# Patient Record
Sex: Male | Born: 1950 | Race: White | Hispanic: No | Marital: Married | Smoking: Never smoker
Health system: Southern US, Community
[De-identification: ages and names within clinical notes are randomized; demographics above are authoritative.]

## PROBLEM LIST (undated history)

## (undated) DIAGNOSIS — I499 Cardiac arrhythmia, unspecified: Secondary | ICD-10-CM

## (undated) DIAGNOSIS — H409 Unspecified glaucoma: Secondary | ICD-10-CM

## (undated) DIAGNOSIS — E785 Hyperlipidemia, unspecified: Secondary | ICD-10-CM

## (undated) HISTORY — DX: Cardiac arrhythmia, unspecified: I49.9

## (undated) HISTORY — PX: HAND SURGERY: SHX662

## (undated) HISTORY — DX: Hyperlipidemia, unspecified: E78.5

## (undated) HISTORY — DX: Unspecified glaucoma: H40.9

---
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
## 2004-03-23 HISTORY — PX: FLEXIBLE SIGMOIDOSCOPY: SHX1649

## 2008-04-23 HISTORY — PX: COLONOSCOPY: SHX174

## 2014-07-23 ENCOUNTER — Emergency Department (HOSPITAL_COMMUNITY)
Admission: EM | Admit: 2014-07-23 | Discharge: 2014-07-23 | Disposition: A | Discharge status: Home / Self Care | Payer: BC Managed Care – PPO | Attending: Emergency Medicine | Admitting: Emergency Medicine

## 2014-07-23 ENCOUNTER — Encounter (HOSPITAL_COMMUNITY): Payer: Self-pay | Admitting: Emergency Medicine

## 2014-07-23 DIAGNOSIS — R5383 Other fatigue: Secondary | ICD-10-CM | POA: Diagnosis not present

## 2014-07-23 DIAGNOSIS — R300 Dysuria: Secondary | ICD-10-CM | POA: Diagnosis present

## 2014-07-23 DIAGNOSIS — N410 Acute prostatitis: Secondary | ICD-10-CM | POA: Insufficient documentation

## 2014-07-23 DIAGNOSIS — Z79899 Other long term (current) drug therapy: Secondary | ICD-10-CM | POA: Diagnosis not present

## 2014-07-23 DIAGNOSIS — Z8639 Personal history of other endocrine, nutritional and metabolic disease: Secondary | ICD-10-CM | POA: Diagnosis not present

## 2014-07-23 DIAGNOSIS — H409 Unspecified glaucoma: Secondary | ICD-10-CM | POA: Diagnosis not present

## 2014-07-23 LAB — CBC WITH DIFFERENTIAL/PLATELET
Basophils Absolute: 0 10*3/uL (ref 0.0–0.1)
Basophils Relative: 0 % (ref 0–1)
Eosinophils Absolute: 0 10*3/uL (ref 0.0–0.7)
Eosinophils Relative: 0 % (ref 0–5)
HCT: 42.8 % (ref 39.0–52.0)
Hemoglobin: 14.4 g/dL (ref 13.0–17.0)
Lymphocytes Relative: 5 % — ABNORMAL LOW (ref 12–46)
Lymphs Abs: 0.6 10*3/uL — ABNORMAL LOW (ref 0.7–4.0)
MCH: 31.1 pg (ref 26.0–34.0)
MCHC: 33.6 g/dL (ref 30.0–36.0)
MCV: 92.4 fL (ref 78.0–100.0)
Monocytes Absolute: 0.3 10*3/uL (ref 0.1–1.0)
Monocytes Relative: 2 % — ABNORMAL LOW (ref 3–12)
Neutro Abs: 10.7 10*3/uL — ABNORMAL HIGH (ref 1.7–7.7)
Neutrophils Relative %: 93 % — ABNORMAL HIGH (ref 43–77)
Platelets: 180 10*3/uL (ref 150–400)
RBC: 4.63 MIL/uL (ref 4.22–5.81)
RDW: 12.3 % (ref 11.5–15.5)
WBC: 11.6 10*3/uL — ABNORMAL HIGH (ref 4.0–10.5)

## 2014-07-23 LAB — COMPREHENSIVE METABOLIC PANEL
ALT: 19 U/L (ref 0–53)
AST: 22 U/L (ref 0–37)
Albumin: 4.1 g/dL (ref 3.5–5.2)
Alkaline Phosphatase: 58 U/L (ref 39–117)
Anion gap: 7 (ref 5–15)
BUN: 15 mg/dL (ref 6–23)
CO2: 29 mmol/L (ref 19–32)
Calcium: 8.6 mg/dL (ref 8.4–10.5)
Chloride: 101 mmol/L (ref 96–112)
Creatinine, Ser: 0.9 mg/dL (ref 0.50–1.35)
GFR calc Af Amer: >90 mL/min (ref >90)
GFR calc non Af Amer: >90 mL/min (ref >90)
Glucose, Bld: 120 mg/dL — ABNORMAL HIGH (ref 70–99)
Potassium: 3.9 mmol/L (ref 3.5–5.1)
Sodium: 137 mmol/L (ref 135–145)
Total Bilirubin: 1.4 mg/dL — ABNORMAL HIGH (ref 0.3–1.2)
Total Protein: 6.6 g/dL (ref 6.0–8.3)

## 2014-07-23 LAB — URINALYSIS, ROUTINE W REFLEX MICROSCOPIC
Bilirubin Urine: NEGATIVE
Glucose, UA: NEGATIVE mg/dL
Ketones, ur: NEGATIVE mg/dL
Nitrite: NEGATIVE
Protein, ur: NEGATIVE mg/dL
Specific Gravity, Urine: 1.008 (ref 1.005–1.030)
Urobilinogen, UA: 0.2 mg/dL (ref 0.0–1.0)
pH: 5 (ref 5.0–8.0)

## 2014-07-23 LAB — URINE MICROSCOPIC-ADD ON

## 2014-07-23 LAB — I-STAT CG4 LACTIC ACID, ED: Lactic Acid, Venous: 1.32 mmol/L (ref 0.5–2.0)

## 2014-07-23 MED ORDER — CIPROFLOXACIN HCL 500 MG PO TABS
500.0000 mg | ORAL_TABLET | Freq: Once | ORAL | Status: AC
  Administered 2014-07-23: 500 mg via ORAL
  Filled 2014-07-23: qty 1

## 2014-07-23 MED ORDER — SODIUM CHLORIDE 0.9 % IV BOLUS (SEPSIS)
1000.0000 mL | Freq: Once | INTRAVENOUS | Status: AC
  Administered 2014-07-23: 1000 mL via INTRAVENOUS

## 2014-07-23 MED ORDER — IBUPROFEN 600 MG PO TABS: 600.0000 mg | ORAL_TABLET | Freq: Four times a day (QID) | ORAL | Status: DC | PRN

## 2014-07-23 MED ORDER — CIPROFLOXACIN HCL 500 MG PO TABS: 500.0000 mg | ORAL_TABLET | Freq: Two times a day (BID) | ORAL | Status: DC

## 2014-07-23 MED ORDER — ACETAMINOPHEN 325 MG PO TABS
650.0000 mg | ORAL_TABLET | Freq: Once | ORAL | Status: AC
  Administered 2014-07-23: 650 mg via ORAL
  Filled 2014-07-23: qty 2

## 2014-07-23 MED ORDER — HYDROCODONE-ACETAMINOPHEN 5-325 MG PO TABS: 1.0000 | ORAL_TABLET | Freq: Four times a day (QID) | ORAL | Status: DC | PRN

## 2014-07-23 MED ORDER — ACETAMINOPHEN 650 MG RE SUPP: 650.0000 mg | Freq: Once | RECTAL | Status: DC

## 2014-07-23 NOTE — ED Notes (Signed)
Pt c/o dysuria and fever since yesterday. Pt has hx of UTI. Pt took naproxen at 6:45 pm tonight. A&Ox4. Pt ambulatory. Pt c/o chills. Denies N/V. Denies blood in urine.

## 2014-07-23 NOTE — ED Notes (Signed)
Patient's wife asking to speak with PA prior to DC home Will make EDP aware

## 2014-07-23 NOTE — ED Provider Notes (Signed)
CSN: [id number]     Arrival date & time 07/23/14  1932 History   First MD Initiated Contact with Patient 07/23/14 2030     Chief Complaint  Patient presents with  . Dysuria  . Fever    (Consider location/radiation/quality/duration/timing/severity/associated sxs/prior Treatment) HPI Comments: Patient is a 64 year old male with a history of hyperlipidemia and appendectomy who presents to the emergency department for further evaluation of fever. Patient states that he experienced some dysuria this morning which resolved. He states he was away seeing his daughter when he began to feel fatigued with an ache in his low back this afternoon. Patient took ibuprofen at 1500 for symptoms with mild, temporary relief. He states that he started to develop worsening back pain with chills this evening. This did not improve with naproxen. Patient reports a temperature of 100.72F today. He denies associated chest pain, shortness of breath, cough, lightheadedness, nausea, vomiting, and diarrhea. No hematuria. Patient reports a history of UTI/prostatitis while in college in 1973. No history of frequent UTIs. No sick contacts. Patient states he is sexually active with his wife only.  The history is provided by the patient. No language interpreter was used.    Past Medical History  Diagnosis Date  . Hyperlipidemia   . Glaucoma     pigmentary   Past Surgical History  Procedure Laterality Date  . Appendectomy  1966  . Tonsillectomy  1972  . Flexible sigmoidoscopy  10/05  . Colonoscopy  11/09  . Hand surgery      R hand broken repair    Family History  Problem Relation Age of Onset  . Ovarian cancer Mother   . Schizophrenia Sister    History  Substance Use Topics  . Smoking status: Never Smoker   . Smokeless tobacco: Not on file  . Alcohol Use: Yes    Review of Systems  Constitutional: Positive for fever, chills and fatigue.  Gastrointestinal: Negative for nausea, vomiting, abdominal pain and  diarrhea.  Genitourinary: Positive for dysuria. Negative for hematuria, discharge and scrotal swelling.       Negative for incontinence  Musculoskeletal: Positive for back pain.  All other systems reviewed and are negative.   Allergies  Review of patient's allergies indicates no known allergies.  Home Medications   Prior to Admission medications   Medication Sig Start Date End Date Taking? Authorizing Provider  brimonidine (ALPHAGAN) 0.15 % ophthalmic solution Place 1 drop into the right eye 2 (two) times daily.  07/13/14  Yes Historical Provider, MD  Cranberry-Vitamin C-Probiotic (AZO CRANBERRY) 250-30 MG TABS Take 2 tablets by mouth 2 (two) times daily as needed (pain).   Yes Historical Provider, MD  cromolyn (OPTICROM) 4 % ophthalmic solution Place 1 drop into the left eye 2 (two) times daily.  07/13/14  Yes Historical Provider, MD  dorzolamide-timolol (COSOPT) 22.3-6.8 MG/ML ophthalmic solution Place 1 drop into both eyes 2 (two) times daily. Cosopt 22.3-6.8 MG/ML Solution 1 drop into affected eye Twice a day   Yes Historical Provider, MD  latanoprost (XALATAN) 0.005 % ophthalmic solution Place 1 drop into the left eye at bedtime.   Yes Historical Provider, MD  Multiple Vitamin (MULTIVITAMIN) tablet Take 1 tablet by mouth daily.   Yes Historical Provider, MD  naproxen sodium (ANAPROX) 220 MG tablet Take 220 mg by mouth 2 (two) times daily as needed (pain).   Yes Historical Provider, MD  Omega-3 Fatty Acids (FISH OIL PO) Take 1,000 mg by mouth daily. Fish Oil 1000 MG Capsule 1  capsule with a meal Once a day   Yes Historical Provider, MD  Red Yeast Rice Extract (RED YEAST RICE PO) Take 600 mg by mouth daily.    Yes Historical Provider, MD  ciprofloxacin (CIPRO) 500 MG tablet Take 1 tablet (500 mg total) by mouth 2 (two) times daily. Take for the full 6 weeks unless otherwise instructed by a urologist 07/23/14   Antony Madura, PA-C  HYDROcodone-acetaminophen (NORCO/VICODIN) 5-325 MG per tablet  Take 1-2 tablets by mouth every 6 (six) hours as needed for moderate pain or severe pain. 07/23/14   Antony Madura, PA-C  ibuprofen (ADVIL,MOTRIN) 600 MG tablet Take 1 tablet (600 mg total) by mouth every 6 (six) hours as needed for fever, mild pain or moderate pain. 07/23/14   Antony Madura, PA-C   BP 120/60 mmHg  Pulse 92  Temp(Src) 100.5 F (38.1 C) (Oral)  Resp 18  SpO2 98%   Physical Exam  Constitutional: He is oriented to person, place, and time. He appears well-developed and well-nourished. No distress.  Nontoxic/nonseptic appearing  HENT:  Head: Normocephalic and atraumatic.  Eyes: Conjunctivae and EOM are normal. No scleral icterus.  Neck: Normal range of motion.  Cardiovascular: Normal rate, regular rhythm and intact distal pulses.   Pulmonary/Chest: Effort normal and breath sounds normal. No respiratory distress. He has no wheezes. He has no rales.  Respirations even and unlabored. Lungs clear.  Abdominal: Soft. He exhibits no distension. There is no tenderness. There is no rebound and no guarding.  No abdominal tenderness or masses. No CVA tenderness. No peritoneal signs or guarding.  Genitourinary: Rectal exam shows no external hemorrhoid, no internal hemorrhoid, no tenderness and anal tone normal. Prostate is enlarged. Prostate is not tender.  Chaperoned rectal exam significant for enlarged, firm prostate without TTP  Musculoskeletal: Normal range of motion. He exhibits tenderness.  Mild tenderness to lumbar spine without bony deformities, step-offs, or crepitus. No paraspinal muscle tenderness.  Neurological: He is alert and oriented to person, place, and time.  Skin: Skin is warm and dry. No rash noted. He is not diaphoretic. No erythema. No pallor.  Psychiatric: He has a normal mood and affect. His behavior is normal.  Nursing note and vitals reviewed.   ED Course  Procedures (including critical care time) Labs Review Labs Reviewed  CBC WITH DIFFERENTIAL/PLATELET -  Abnormal; Notable for the following:    WBC 11.6 (*)    Neutrophils Relative % 93 (*)    Neutro Abs 10.7 (*)    Lymphocytes Relative 5 (*)    Lymphs Abs 0.6 (*)    Monocytes Relative 2 (*)    All other components within normal limits  COMPREHENSIVE METABOLIC PANEL - Abnormal; Notable for the following:    Glucose, Bld 120 (*)    Total Bilirubin 1.4 (*)    All other components within normal limits  URINALYSIS, ROUTINE W REFLEX MICROSCOPIC - Abnormal; Notable for the following:    APPearance CLOUDY (*)    Hgb urine dipstick SMALL (*)    Leukocytes, UA MODERATE (*)    All other components within normal limits  URINE CULTURE  URINE MICROSCOPIC-ADD ON  I-STAT CG4 LACTIC ACID, ED    Imaging Review No results found.   EKG Interpretation None      MDM   Final diagnoses:  Acute prostatitis    64 year old male presents to the emergency department for dysuria, back pain, and fever. He was febrile on arrival to 101.31F. This improved with antipyretics. No abdominal  tenderness to palpation. No CVA tenderness. Patient was noted to have an enlarged prostate on chaperoned rectal exam.   Workup today revealed a mild leukocytosis of 11.6. Urinalysis significant for TNTC WBCs. Given physical exam findings, high suspicion for prostatitis. Patient endorses a hx of this. He will be initiated on treatment with ciprofloxacin. Urine culture ordered. Patient also given referral to urology for follow-up. Return precautions discussed at length with patient. Patient agreeable to plan with no unaddressed concerns. Patient discharged in good condition; VSS.   Filed Vitals:   07/23/14 1948 07/23/14 2229 07/23/14 2302  BP: 141/78  120/60  Pulse: 82  92  Temp: 101.7 F (38.7 C) 101.9 F (38.8 C) 100.5 F (38.1 C)  TempSrc: Oral Oral Oral  Resp: 16  18  SpO2: 97%  98%       Antony Madura, PA-C 07/23/14 2344  Toy Baker, MD 07/26/14 [phone]

## 2014-07-23 NOTE — Discharge Instructions (Signed)
Take ciprofloxacin as prescribed. You will need to be treated with antibiotics for 6 weeks unless otherwise instructed by a urologist. Take ibuprofen every 6 hours as prescribed for fever and pain. You may take Norco as needed for severe pain. Do not take Tylenol if you are taking this medication. Recommend that you follow-up with a urologist. Also follow-up with your primary care doctor until you are able to see a urologist, for purposes of recheck. Return to the emergency department if symptoms worsen, such as if fever persists after an additional 2 doses of ciprofloxacin.  Prostatitis The prostate gland is about the size and shape of a walnut. It is located just below your bladder. It produces one of the components of semen, which is made up of sperm and the fluids that help nourish and transport it out from the testicles. Prostatitis is inflammation of the prostate gland.  There are four types of prostatitis:  Acute bacterial prostatitis. This is the least common type of prostatitis. It starts quickly and usually is associated with a bladder infection, high fever, and shaking chills. It can occur at any age.  Chronic bacterial prostatitis. This is a persistent bacterial infection in the prostate. It usually develops from repeated acute bacterial prostatitis or acute bacterial prostatitis that was not properly treated. It can occur in men of any age but is most common in middle-aged men whose prostate has begun to enlarge. The symptoms are not as severe as those in acute bacterial prostatitis. Discomfort in the part of your body that is in front of your rectum and below your scrotum (perineum), lower abdomen, or in the head of your penis (glans) may represent your primary discomfort.  Chronic prostatitis (nonbacterial). This is the most common type of prostatitis. It is inflammation of the prostate gland that is not caused by a bacterial infection. The cause is unknown and may be associated with a  viral infection or autoimmune disorder.  Prostatodynia (pelvic floor disorder). This is associated with increased muscular tone in the pelvis surrounding the prostate. CAUSES The causes of bacterial prostatitis are bacterial infection. The causes of the other types of prostatitis are unknown.  SYMPTOMS  Symptoms can vary depending upon the type of prostatitis that exists. There can also be overlap in symptoms. Possible symptoms for each type of prostatitis are listed below. Acute Bacterial Prostatitis  Painful urination.  Fever or chills.  Muscle or joint pains.  Low back pain.  Low abdominal pain.  Inability to empty bladder completely. Chronic Bacterial Prostatitis, Chronic Nonbacterial Prostatitis, and Prostatodynia  Sudden urge to urinate.  Frequent urination.  Difficulty starting urine stream.  Weak urine stream.  Discharge from the urethra.  Dribbling after urination.  Rectal pain.  Pain in the testicles, penis, or tip of the penis.  Pain in the perineum.  Problems with sexual function.  Painful ejaculation.  Bloody semen. DIAGNOSIS  In order to diagnose prostatitis, your health care provider will ask about your symptoms. One or more urine samples will be taken and tested (urinalysis). If the urinalysis result is negative for bacteria, your health care provider may use a finger to feel your prostate (digital rectal exam). This exam helps your health care provider determine if your prostate is swollen and tender. It will also produce a specimen of semen that can be analyzed. TREATMENT  Treatment for prostatitis depends on the cause. If a bacterial infection is the cause, it can be treated with antibiotic medicine. In cases of chronic bacterial prostatitis,  the use of antibiotics for up to 1 month or 6 weeks may be necessary. Your health care provider may instruct you to take sitz baths to help relieve pain. A sitz bath is a bath of hot water in which your hips and  buttocks are under water. This relaxes the pelvic floor muscles and often helps to relieve the pressure on your prostate. HOME CARE INSTRUCTIONS   Take all medicines as directed by your health care provider.  Take sitz baths as directed by your health care provider. SEEK MEDICAL CARE IF:   Your symptoms get worse, not better.  You have a fever. SEEK IMMEDIATE MEDICAL CARE IF:   You have chills.  You feel nauseous or vomit.  You feel lightheaded or faint.  You are unable to urinate.  You have blood or blood clots in your urine. MAKE SURE YOU:  Understand these instructions.  Will watch your condition.  Will get help right away if you are not doing well or get worse. Document Released: 06/06/2000 Document Revised: 06/14/2013 Document Reviewed: 12/27/2012 [facility] Patient Information 2015 [employer], [place]. This information is not intended to replace advice given to you by your health care provider. Make sure you discuss any questions you have with your health care provider.

## 2014-07-23 NOTE — ED Notes (Signed)

## 2014-07-23 NOTE — ED Notes (Signed)
Significant amount of time spent reviewing DC instructions, follow up with urology and Rx x 3 with patient and pt's wife Both nursing staff and Tresa EndoKelly, [place]PA answered multiple and repeat questions asked by the wife Wife informed several times that urology needs to be called tomorrow morning to make an appointment due to length of time that patient will be on Cipro

## 2014-07-26 LAB — URINE CULTURE: Colony Count: >=100000

## 2014-07-27 ENCOUNTER — Telehealth: Payer: Self-pay | Admitting: *Deleted

## 2014-07-27 NOTE — ED Notes (Signed)
(+)  urine culture, No follow up required per Antoine PrimasE. Martin, Pharm

## 2014-07-28 ENCOUNTER — Telehealth: Payer: Self-pay | Admitting: *Deleted

## 2014-07-28 NOTE — ED Notes (Signed)
(+)  urine culture, no further treatment needed, J. Frens, Pharm 

## 2016-07-08 ENCOUNTER — Other Ambulatory Visit: Payer: Self-pay | Admitting: Cardiology

## 2016-07-08 ENCOUNTER — Ambulatory Visit
Admission: RE | Admit: 2016-07-08 | Discharge: 2016-07-08 | Disposition: A | Discharge status: Home / Self Care | Payer: BC Managed Care – PPO | Source: Ambulatory Visit | Attending: Cardiology | Admitting: Cardiology

## 2016-07-08 DIAGNOSIS — R0789 Other chest pain: Secondary | ICD-10-CM

## 2016-07-08 DIAGNOSIS — E785 Hyperlipidemia, unspecified: Secondary | ICD-10-CM

## 2016-07-16 ENCOUNTER — Ambulatory Visit
Admission: RE | Admit: 2016-07-16 | Discharge: 2016-07-16 | Disposition: A | Discharge status: Home / Self Care | Payer: BC Managed Care – PPO | Source: Ambulatory Visit | Attending: Cardiology | Admitting: Cardiology

## 2016-07-16 DIAGNOSIS — E785 Hyperlipidemia, unspecified: Secondary | ICD-10-CM

## 2017-08-04 ENCOUNTER — Other Ambulatory Visit: Payer: Self-pay | Admitting: Family Medicine

## 2017-08-04 DIAGNOSIS — R911 Solitary pulmonary nodule: Secondary | ICD-10-CM

## 2017-08-20 ENCOUNTER — Other Ambulatory Visit: Payer: BC Managed Care – PPO

## 2018-08-11 ENCOUNTER — Other Ambulatory Visit: Payer: Self-pay | Admitting: Family Medicine

## 2018-08-11 DIAGNOSIS — R911 Solitary pulmonary nodule: Secondary | ICD-10-CM

## 2018-08-16 ENCOUNTER — Ambulatory Visit
Admission: RE | Admit: 2018-08-16 | Discharge: 2018-08-16 | Disposition: A | Discharge status: Home / Self Care | Payer: Medicare Other | Source: Ambulatory Visit | Attending: Family Medicine | Admitting: Family Medicine

## 2018-08-16 DIAGNOSIS — R911 Solitary pulmonary nodule: Secondary | ICD-10-CM

## 2019-08-06 ENCOUNTER — Ambulatory Visit: Payer: Self-pay

## 2019-11-09 DIAGNOSIS — H401313 Pigmentary glaucoma, right eye, severe stage: Secondary | ICD-10-CM | POA: Diagnosis not present

## 2019-11-09 DIAGNOSIS — H401322 Pigmentary glaucoma, left eye, moderate stage: Secondary | ICD-10-CM | POA: Diagnosis not present

## 2019-11-28 DIAGNOSIS — H401322 Pigmentary glaucoma, left eye, moderate stage: Secondary | ICD-10-CM | POA: Diagnosis not present

## 2019-11-28 DIAGNOSIS — H401313 Pigmentary glaucoma, right eye, severe stage: Secondary | ICD-10-CM | POA: Diagnosis not present

## 2019-12-22 DIAGNOSIS — L570 Actinic keratosis: Secondary | ICD-10-CM | POA: Diagnosis not present

## 2019-12-22 DIAGNOSIS — D1801 Hemangioma of skin and subcutaneous tissue: Secondary | ICD-10-CM | POA: Diagnosis not present

## 2019-12-22 DIAGNOSIS — L821 Other seborrheic keratosis: Secondary | ICD-10-CM | POA: Diagnosis not present

## 2019-12-22 DIAGNOSIS — L814 Other melanin hyperpigmentation: Secondary | ICD-10-CM | POA: Diagnosis not present

## 2019-12-22 DIAGNOSIS — I8393 Asymptomatic varicose veins of bilateral lower extremities: Secondary | ICD-10-CM | POA: Diagnosis not present

## 2019-12-22 DIAGNOSIS — L819 Disorder of pigmentation, unspecified: Secondary | ICD-10-CM | POA: Diagnosis not present

## 2019-12-22 DIAGNOSIS — D229 Melanocytic nevi, unspecified: Secondary | ICD-10-CM | POA: Diagnosis not present

## 2019-12-22 DIAGNOSIS — Z85828 Personal history of other malignant neoplasm of skin: Secondary | ICD-10-CM | POA: Diagnosis not present

## 2019-12-22 DIAGNOSIS — L905 Scar conditions and fibrosis of skin: Secondary | ICD-10-CM | POA: Diagnosis not present

## 2020-03-14 DIAGNOSIS — H401313 Pigmentary glaucoma, right eye, severe stage: Secondary | ICD-10-CM | POA: Diagnosis not present

## 2020-03-14 DIAGNOSIS — H401322 Pigmentary glaucoma, left eye, moderate stage: Secondary | ICD-10-CM | POA: Diagnosis not present

## 2020-03-21 ENCOUNTER — Other Ambulatory Visit: Payer: Self-pay

## 2020-03-21 DIAGNOSIS — Z20822 Contact with and (suspected) exposure to covid-19: Secondary | ICD-10-CM

## 2020-03-22 LAB — SARS-COV-2, NAA 2 DAY TAT

## 2020-03-22 LAB — NOVEL CORONAVIRUS, NAA: SARS-CoV-2, NAA: NOT DETECTED

## 2020-07-18 DIAGNOSIS — H401322 Pigmentary glaucoma, left eye, moderate stage: Secondary | ICD-10-CM | POA: Diagnosis not present

## 2020-07-18 DIAGNOSIS — H401313 Pigmentary glaucoma, right eye, severe stage: Secondary | ICD-10-CM | POA: Diagnosis not present

## 2020-07-27 DIAGNOSIS — D23122 Other benign neoplasm of skin of left lower eyelid, including canthus: Secondary | ICD-10-CM | POA: Diagnosis not present

## 2020-08-06 DIAGNOSIS — D229 Melanocytic nevi, unspecified: Secondary | ICD-10-CM | POA: Diagnosis not present

## 2020-08-06 DIAGNOSIS — D485 Neoplasm of uncertain behavior of skin: Secondary | ICD-10-CM | POA: Diagnosis not present

## 2020-08-06 DIAGNOSIS — L819 Disorder of pigmentation, unspecified: Secondary | ICD-10-CM | POA: Diagnosis not present

## 2020-08-06 DIAGNOSIS — L570 Actinic keratosis: Secondary | ICD-10-CM | POA: Diagnosis not present

## 2020-08-06 DIAGNOSIS — L821 Other seborrheic keratosis: Secondary | ICD-10-CM | POA: Diagnosis not present

## 2020-08-06 DIAGNOSIS — L814 Other melanin hyperpigmentation: Secondary | ICD-10-CM | POA: Diagnosis not present

## 2020-08-06 DIAGNOSIS — C44222 Squamous cell carcinoma of skin of right ear and external auricular canal: Secondary | ICD-10-CM | POA: Diagnosis not present

## 2020-09-03 DIAGNOSIS — R972 Elevated prostate specific antigen [PSA]: Secondary | ICD-10-CM | POA: Diagnosis not present

## 2020-09-03 DIAGNOSIS — I4510 Unspecified right bundle-branch block: Secondary | ICD-10-CM | POA: Diagnosis not present

## 2020-09-03 DIAGNOSIS — Z0000 Encounter for general adult medical examination without abnormal findings: Secondary | ICD-10-CM | POA: Diagnosis not present

## 2020-09-03 DIAGNOSIS — E7800 Pure hypercholesterolemia, unspecified: Secondary | ICD-10-CM | POA: Diagnosis not present

## 2020-09-03 DIAGNOSIS — R0689 Other abnormalities of breathing: Secondary | ICD-10-CM | POA: Diagnosis not present

## 2020-09-03 DIAGNOSIS — H409 Unspecified glaucoma: Secondary | ICD-10-CM | POA: Diagnosis not present

## 2020-09-12 DIAGNOSIS — D0421 Carcinoma in situ of skin of right ear and external auricular canal: Secondary | ICD-10-CM | POA: Diagnosis not present

## 2020-09-12 DIAGNOSIS — Z85828 Personal history of other malignant neoplasm of skin: Secondary | ICD-10-CM | POA: Diagnosis not present

## 2020-09-12 DIAGNOSIS — L905 Scar conditions and fibrosis of skin: Secondary | ICD-10-CM | POA: Diagnosis not present

## 2020-09-12 DIAGNOSIS — L570 Actinic keratosis: Secondary | ICD-10-CM | POA: Diagnosis not present

## 2020-09-12 DIAGNOSIS — L819 Disorder of pigmentation, unspecified: Secondary | ICD-10-CM | POA: Diagnosis not present

## 2020-09-12 NOTE — Progress Notes (Signed)
Cardiology Office Note:   Date:  09/13/2020  NAME:  Raymond Petty    MRN: [id number] DOB:  08-24-1950   PCP:  Lupita Raider, MD  Cardiologist:  No primary care provider on file.  Electrophysiologist:  None   Referring MD: Lupita Raider, MD   Chief Complaint  Patient presents with  . Palpitations         History of Present Illness:   Raymond Petty is a 70 y.o. male with a hx of HLD who is being seen today for the evaluation of palpitations at the request of Lupita Raider, MD.  He reports he had coronavirus infection in December 2021.  Since that time has had fatigue and what he describes as an abnormal sensation in his chest when he does walking upstairs.  He reports strings activity can cause a fluttering in his chest.  He reports this is not usual for him.  Despite this he can play tennis once a week.  He reports he can walk several miles per day without any significant symptoms.  He may experience them periodically.  His medical history is significant for hyperlipidemia.  Most recent LDL cholesterol 169.  He underwent coronary calcium scoring in 2018 that was 0.  His EKG demonstrates a right bundle branch block which she has had for years.  He has no history of high blood pressure.  He has never had a heart attack or stroke.  He is a never smoker.  He does not drink alcohol or use drugs.  He really takes no medications.  He reports his father had heart disease.  He works locally as an Pensions consultant.  He reports his symptoms occur a few times per week.  They are bothersome.  He reports things just feel different since coronavirus infection.  Total cholesterol 233, HDL 39, LDL 169, triglycerides 135 CT CAC score 0 2018  Past Medical History: Past Medical History:  Diagnosis Date  . Glaucoma    pigmentary  . Hyperlipidemia     Past Surgical History: Past Surgical History:  Procedure Laterality Date  . APPENDECTOMY  1966  . COLONOSCOPY  11/09  . FLEXIBLE SIGMOIDOSCOPY  10/05  .  HAND SURGERY     R hand broken repair   . TONSILLECTOMY  1972    Current Medications: Current Meds  Medication Sig  . brimonidine (ALPHAGAN) 0.15 % ophthalmic solution Place 1 drop into the right eye 2 (two) times daily.   . cromolyn (OPTICROM) 4 % ophthalmic solution Place 1 drop into the left eye 2 (two) times daily.   . dorzolamide-timolol (COSOPT) 22.3-6.8 MG/ML ophthalmic solution Place 1 drop into both eyes 2 (two) times daily. Cosopt 22.3-6.8 MG/ML Solution 1 drop into affected eye Twice a day  . latanoprost (XALATAN) 0.005 % ophthalmic solution Place 1 drop into the left eye at bedtime.  . metoprolol tartrate (LOPRESSOR) 50 MG tablet Take 1 tablet by mouth once for procedure.  . Multiple Vitamin (MULTIVITAMIN) tablet Take 1 tablet by mouth daily.  Heywood Iles Dimesylate (RHOPRESSA) 0.02 % SOLN 1 drop into affected eye in the evening  . Omega-3 Fatty Acids (FISH OIL PO) Take 1,000 mg by mouth daily. Fish Oil 1000 MG Capsule 1 capsule with a meal Once a day     Allergies:    Patient has no known allergies.   Social History: Social History   Socioeconomic History  . Marital status: Married    Spouse name: Not on file  . Number of children: 3  .  Years of education: Not on file  . Highest education level: Not on file  Occupational History  . Occupation: Attorney  Tobacco Use  . Smoking status: Never Smoker  . Smokeless tobacco: Never Used  Substance and Sexual Activity  . Alcohol use: Yes  . Drug use: Never  . Sexual activity: Not on file  Other Topics Concern  . Not on file  Social History Narrative  . Not on file   Social Determinants of Health   Financial Resource Strain: Not on file  Food Insecurity: Not on file  Transportation Needs: Not on file  Physical Activity: Not on file  Stress: Not on file  Social Connections: Not on file     Family History: The patient's family history includes Heart attack in his father; Ovarian cancer in his mother;  Schizophrenia in his sister; Stroke in his father.  ROS:   All other ROS reviewed and negative. Pertinent positives noted in the HPI.     EKGs/Labs/Other Studies Reviewed:   The following studies were personally reviewed by me today:  EKG:  EKG is ordered today.  The ekg ordered today demonstrates normal sinus rhythm heart rate 65, right bundle branch block, and was personally reviewed by me.   Recent Labs: No results found for requested labs within last 8760 hours.   Recent Lipid Panel No results found for: CHOL, TRIG, HDL, CHOLHDL, VLDL, LDLCALC, LDLDIRECT  Physical Exam:   VS:  BP (!) 146/82 (BP Location: Left Arm, Patient Position: Sitting, Cuff Size: Normal)   Pulse 65   Ht 6' (1.829 m)   Wt 201 lb 9.6 oz (91.4 kg)   SpO2 94%   BMI 27.34 kg/m    Wt Readings from Last 3 Encounters:  09/13/20 201 lb 9.6 oz (91.4 kg)    General: Well nourished, well developed, in no acute distress Head: Atraumatic, normal size  Eyes: PEERLA, EOMI  Neck: Supple, no JVD Endocrine: No thryomegaly Cardiac: Normal S1, S2; RRR; no murmurs, rubs, or gallops Lungs: Clear to auscultation bilaterally, no wheezing, rhonchi or rales  Abd: Soft, nontender, no hepatomegaly  Ext: No edema, pulses 2+ Musculoskeletal: No deformities, BUE and BLE strength normal and equal Skin: Warm and dry, no rashes   Neuro: Alert and oriented to person, place, time, and situation, CNII-XII grossly intact, no focal deficits  Psych: Normal mood and affect   ASSESSMENT:   Raymond Petty is a 70 y.o. male who presents for the following: 1. Palpitations   2. Chest pain of uncertain etiology     PLAN:   1. Palpitations 2. Chest pain of uncertain etiology -He is having a discomfort in his chest when he exerts himself.  This occurs with walking up stairs or doing very strenuous activity.  CVD risk factors include hyperlipidemia with an LDL of 169.  BP slightly elevated.  Recent TSH 0.94.  Despite his activity which  occurs with climbing stairs he is able to maintain high level of activity.  He did have a coronary calcium score in 2018 it was 0.  I am a little concerned his symptoms could represent angina.  We may be missing soft plaque.  I have recommended coronary CTA for further evaluation.  He will take 50 mg of metoprolol tartrate 2 hours before the scan.  I would also like to proceed with a 7-day Zio patch to occlude any arrhythmias.  He will see [place] as needed based on the results of the scans.  Symptoms could just be  related to recovering from Covid infection.  We will make sure there is no cardiac issue here.   Disposition: Return if symptoms worsen or fail to improve.  Medication Adjustments/Labs and Tests Ordered: Current medicines are reviewed at length with the patient today.  Concerns regarding medicines are outlined above.  Orders Placed This Encounter  Procedures  . CT CORONARY MORPH W/CTA COR W/SCORE W/CA W/CM &/OR WO/CM  . CT CORONARY FRACTIONAL FLOW RESERVE DATA PREP  . CT CORONARY FRACTIONAL FLOW RESERVE FLUID ANALYSIS  . Basic metabolic panel  . LONG TERM MONITOR (3-14 DAYS)  . EKG 12-Lead   Meds ordered this encounter  Medications  . metoprolol tartrate (LOPRESSOR) 50 MG tablet    Sig: Take 1 tablet by mouth once for procedure.    Dispense:  1 tablet    Refill:  0    Patient Instructions  Medication Instructions:  Take Metoprolol 50 mg two hours before the CT when scheduled.   *If you need a refill on your cardiac medications before your next appointment, please call your pharmacy*   Lab Work: BMET today   If you have labs (blood work) drawn today and your tests are completely normal, you will receive your results only by: Marland Kitchen MyChart Message (if you have MyChart) OR . A paper copy in the mail If you have any lab test that is abnormal or we need to change your treatment, we will call you to review the results.   Testing/Procedures: Your physician has requested that you  have cardiac CT. Cardiac computed tomography (CT) is a painless test that uses an x-ray machine to take clear, detailed pictures of your heart. For further information please visit [email or web address] Please follow instruction sheet as given.  ZIO XT- Long Term Monitor Instructions   Your physician has requested you wear your ZIO patch monitor____7___days.   This is a single patch monitor.  [employer] supplies one patch monitor per enrollment.  Additional stickers are not available.   Please do not apply patch if you will be having a Nuclear Stress Test, Echocardiogram, Cardiac CT, MRI, or Chest Xray during the time frame you would be wearing the monitor. The patch cannot be worn during these tests.  You cannot remove and re-apply the ZIO XT patch monitor.   Your ZIO patch monitor will be sent USPS Priority mail from [facility] directly to your home address. The monitor may also be mailed to a PO BOX if home delivery is not available.   It may take 3-5 days to receive your monitor after you have been enrolled.   Once you have received you monitor, please review enclosed instructions.  Your monitor has already been registered assigning a specific monitor serial # to you.   Applying the monitor   Shave hair from upper left chest.   Hold abrader disc by orange tab.  Rub abrader in 40 strokes over left upper chest as indicated in your monitor instructions.   Clean area with 4 enclosed alcohol pads .  Use all pads to assure are is cleaned thoroughly.  Let dry.   Apply patch as indicated in monitor instructions.  Patch will be place under collarbone on left side of chest with arrow pointing upward.   Rub patch adhesive wings for 2 minutes.Remove white label marked "1".  Remove white label marked "2".  Rub patch adhesive wings for 2 additional minutes.   While looking in a mirror, press and release button in center of patch.  A  small green light will flash 3-4 times .  This will be your  only indicator the monitor has been turned on.     Do not shower for the first 24 hours.  You may shower after the first 24 hours.   Press button if you feel a symptom. You will hear a small click.  Record Date, Time and Symptom in the Patient Log Book.   When you are ready to remove patch, follow instructions on last 2 pages of Patient Log Book.  Stick patch monitor onto last page of Patient Log Book.   Place Patient Log Book in [place] box.  Use locking tab on box and tape box closed securely.  The Orange and [employer] has [employer] on it.  Please place in mailbox as soon as possible.  Your physician should have your test results approximately 7 days after the monitor has been mailed back to [facility].   Call [facility] Customer Care at [phone] if you have questions regarding your ZIO XT patch monitor.  Call them immediately if you see an orange light blinking on your monitor.   If your monitor falls off in less than 4 days contact our Monitor department at [phone].  If your monitor becomes loose or falls off after 4 days call [employer] at [phone] for suggestions on securing your monitor.     Follow-Up: At [facility], you and your health needs are our priority.  As part of our continuing mission to provide you with exceptional heart care, we have created designated Provider Care Teams.  These Care Teams include your primary Cardiologist (physician) and Advanced Practice Providers (APPs -  Physician Assistants and Nurse Practitioners) who all work together to provide you with the care you need, when you need it.  We recommend signing up for the patient portal called "MyChart".  Sign up information is provided on this After Visit Summary.  MyChart is used to connect with patients for Virtual Visits (Telemedicine).  Patients are able to view lab/test results, encounter notes, upcoming appointments, etc.  Non-urgent messages can be sent to your provider as well.   To  learn more about what you can do with MyChart, go to [email or web address].    Your next appointment:   As needed  The format for your next appointment:   In Person  Provider:   Lennie Odor, MD   Other Instructions Your cardiac CT will be scheduled at one of the below locations:   [facility] [address] [place], [place] [address] [phone]   If scheduled at [facility], please arrive at the [facility] main entrance (entrance A) of [facility] 30 minutes prior to test start time. Proceed to the [facility] Radiology Department (first floor) to check-in and test prep.   Please follow these instructions carefully (unless otherwise directed):  Hold all erectile dysfunction medications at least 3 days (72 hrs) prior to test.  On the Night Before the Test: . Be sure to Drink plenty of water. . Do not consume any caffeinated/decaffeinated beverages or chocolate 12 hours prior to your test. . Do not take any antihistamines 12 hours prior to your test.  On the Day of the Test: . Drink plenty of water until 1 hour prior to the test. . Do not eat any food 4 hours prior to the test. . You may take your regular medications prior to the test.  . Take metoprolol (Lopressor) two hours prior to test. .  HOLD Furosemide/Hydrochlorothiazide morning of the test. . FEMALES- please wear underwire-free bra if available     After the Test: . Drink plenty of water. . After receiving IV contrast, you may experience a mild flushed feeling. This is normal. . On occasion, you may experience a mild rash up to 24 hours after the test. This is not dangerous. If this occurs, you can take Benadryl 25 mg and increase your fluid intake. . If you experience trouble breathing, this can be serious. If it is severe call 911 IMMEDIATELY. If it is mild, please call our office. . If you take any of these medications: Glipizide/Metformin, Avandament, Glucavance, please do  not take 48 hours after completing test unless otherwise instructed.   Once we have confirmed authorization from your insurance company, we will call you to set up a date and time for your test. Based on how quickly your insurance processes prior authorizations requests, please allow up to 4 weeks to be contacted for scheduling your Cardiac CT appointment. Be advised that routine Cardiac CT appointments could be scheduled as many as 8 weeks after your provider has ordered it.  For non-scheduling related questions, please contact the cardiac imaging nurse navigator should you have any questions/concerns: Rockwell AlexandriaSara Wallace, Cardiac Imaging Nurse Navigator Larey BrickMerle Prescott, Cardiac Imaging Nurse Navigator [facility] Direct Office Dial: [phone]   For scheduling needs, including cancellations and rescheduling, please call [place], [phone].       Signed, Lenna GilfordWesley T. Flora Lipps'Neal, MD, [facility]ACC [place]  [facility]  [address], [address] [place], [place] [address] [phone]  09/13/2020 4:56 PM

## 2020-09-13 ENCOUNTER — Ambulatory Visit: Payer: Medicare PPO | Admitting: Cardiovascular Disease

## 2020-09-13 ENCOUNTER — Encounter: Payer: Self-pay | Admitting: Cardiovascular Disease

## 2020-09-13 ENCOUNTER — Encounter: Payer: Self-pay | Admitting: *Deleted

## 2020-09-13 ENCOUNTER — Other Ambulatory Visit: Payer: Self-pay

## 2020-09-13 ENCOUNTER — Ambulatory Visit (INDEPENDENT_AMBULATORY_CARE_PROVIDER_SITE_OTHER): Payer: Medicare PPO

## 2020-09-13 VITALS — BP 146/82 | HR 65 | Ht 72.0 in | Wt 201.6 lb

## 2020-09-13 DIAGNOSIS — R002 Palpitations: Secondary | ICD-10-CM

## 2020-09-13 DIAGNOSIS — R079 Chest pain, unspecified: Secondary | ICD-10-CM | POA: Diagnosis not present

## 2020-09-13 MED ORDER — METOPROLOL TARTRATE 50 MG PO TABS: ORAL_TABLET | ORAL | 0 refills | Status: DC

## 2020-09-13 NOTE — Patient Instructions (Signed)
Medication Instructions:  Take Metoprolol 50 mg two hours before the CT when scheduled.   *If you need a refill on your cardiac medications before your next appointment, please call your pharmacy*   Lab Work: BMET today   If you have labs (blood work) drawn today and your tests are completely normal, you will receive your results only by: Marland Kitchen MyChart Message (if you have MyChart) OR . A paper copy in the mail If you have any lab test that is abnormal or we need to change your treatment, we will call you to review the results.   Testing/Procedures: Your physician has requested that you have cardiac CT. Cardiac computed tomography (CT) is a painless test that uses an x-ray machine to take clear, detailed pictures of your heart. For further information please visit [email or web address] Please follow instruction sheet as given.  ZIO XT- Long Term Monitor Instructions   Your physician has requested you wear your ZIO patch monitor____7___days.   This is a single patch monitor.  [employer] supplies one patch monitor per enrollment.  Additional stickers are not available.   Please do not apply patch if you will be having a Nuclear Stress Test, Echocardiogram, Cardiac CT, MRI, or Chest Xray during the time frame you would be wearing the monitor. The patch cannot be worn during these tests.  You cannot remove and re-apply the ZIO XT patch monitor.   Your ZIO patch monitor will be sent USPS Priority mail from [facility] directly to your home address. The monitor may also be mailed to a PO BOX if home delivery is not available.   It may take 3-5 days to receive your monitor after you have been enrolled.   Once you have received you monitor, please review enclosed instructions.  Your monitor has already been registered assigning a specific monitor serial # to you.   Applying the monitor   Shave hair from upper left chest.   Hold abrader disc by orange tab.  Rub abrader in 40 strokes over  left upper chest as indicated in your monitor instructions.   Clean area with 4 enclosed alcohol pads .  Use all pads to assure are is cleaned thoroughly.  Let dry.   Apply patch as indicated in monitor instructions.  Patch will be place under collarbone on left side of chest with arrow pointing upward.   Rub patch adhesive wings for 2 minutes.Remove white label marked "1".  Remove white label marked "2".  Rub patch adhesive wings for 2 additional minutes.   While looking in a mirror, press and release button in center of patch.  A small green light will flash 3-4 times .  This will be your only indicator the monitor has been turned on.     Do not shower for the first 24 hours.  You may shower after the first 24 hours.   Press button if you feel a symptom. You will hear a small click.  Record Date, Time and Symptom in the Patient Log Book.   When you are ready to remove patch, follow instructions on last 2 pages of Patient Log Book.  Stick patch monitor onto last page of Patient Log Book.   Place Patient Log Book in [place] box.  Use locking tab on box and tape box closed securely.  The [employer] and [employer] has [employer] on it.  Please place in mailbox as soon as possible.  Your physician should have your test results approximately 7 days after the  monitor has been mailed back to [place].   Call [facility] Customer Care at [phone] if you have questions regarding your ZIO XT patch monitor.  Call them immediately if you see an orange light blinking on your monitor.   If your monitor falls off in less than 4 days contact our Monitor department at [phone].  If your monitor becomes loose or falls off after 4 days call [employer] at [phone] for suggestions on securing your monitor.     Follow-Up: At [facility], you and your health needs are our priority.  As part of our continuing mission to provide you with exceptional heart care, we have created designated  Provider Care Teams.  These Care Teams include your primary Cardiologist (physician) and Advanced Practice Providers (APPs -  Physician Assistants and Nurse Practitioners) who all work together to provide you with the care you need, when you need it.  We recommend signing up for the patient portal called "MyChart".  Sign up information is provided on this After Visit Summary.  MyChart is used to connect with patients for Virtual Visits (Telemedicine).  Patients are able to view lab/test results, encounter notes, upcoming appointments, etc.  Non-urgent messages can be sent to your provider as well.   To learn more about what you can do with MyChart, go to [email or web address].    Your next appointment:   As needed  The format for your next appointment:   In Person  Provider:   Lennie Odor, MD   Other Instructions Your cardiac CT will be scheduled at one of the below locations:   [facility] [address] [place], [place] [address] [phone]   If scheduled at [facility], please arrive at the [facility] main entrance (entrance A) of [facility] 30 minutes prior to test start time. Proceed to the [facility] Radiology Department (first floor) to check-in and test prep.   Please follow these instructions carefully (unless otherwise directed):  Hold all erectile dysfunction medications at least 3 days (72 hrs) prior to test.  On the Night Before the Test: . Be sure to Drink plenty of water. . Do not consume any caffeinated/decaffeinated beverages or chocolate 12 hours prior to your test. . Do not take any antihistamines 12 hours prior to your test.  On the Day of the Test: . Drink plenty of water until 1 hour prior to the test. . Do not eat any food 4 hours prior to the test. . You may take your regular medications prior to the test.  . Take metoprolol (Lopressor) two hours prior to test. . HOLD Furosemide/Hydrochlorothiazide morning of the  test. . FEMALES- please wear underwire-free bra if available     After the Test: . Drink plenty of water. . After receiving IV contrast, you may experience a mild flushed feeling. This is normal. . On occasion, you may experience a mild rash up to 24 hours after the test. This is not dangerous. If this occurs, you can take Benadryl 25 mg and increase your fluid intake. . If you experience trouble breathing, this can be serious. If it is severe call 911 IMMEDIATELY. If it is mild, please call our office. . If you take any of these medications: Glipizide/Metformin, Avandament, Glucavance, please do not take 48 hours after completing test unless otherwise instructed.   Once we have confirmed authorization from your insurance company, we will call you to set up a date and time for your test. Based on  how quickly your insurance processes prior authorizations requests, please allow up to 4 weeks to be contacted for scheduling your Cardiac CT appointment. Be advised that routine Cardiac CT appointments could be scheduled as many as 8 weeks after your provider has ordered it.  For non-scheduling related questions, please contact the cardiac imaging nurse navigator should you have any questions/concerns: Rockwell Alexandria, Cardiac Imaging Nurse Navigator Larey Brick, Cardiac Imaging Nurse Navigator [place] [facility] Direct Office Dial: [phone]   For scheduling needs, including cancellations and rescheduling, please call [place], [phone].

## 2020-09-13 NOTE — Progress Notes (Signed)
Patient ID: Raymond Petty, male   DOB: [birth year], 70 y.o.   MRN: [id number] Patient enrolled for [employer] to ship a 7 day ZIO XT long term monitor to his home.

## 2020-09-14 LAB — BASIC METABOLIC PANEL
BUN/Creatinine Ratio: 15 (ref 10–24)
BUN: 14 mg/dL (ref 8–27)
CO2: 23 mmol/L (ref 20–29)
Calcium: 9.3 mg/dL (ref 8.6–10.2)
Chloride: 105 mmol/L (ref 96–106)
Creatinine, Ser: 0.95 mg/dL (ref 0.76–1.27)
Glucose: 89 mg/dL (ref 65–99)
Potassium: 4.4 mmol/L (ref 3.5–5.2)
Sodium: 142 mmol/L (ref 134–144)
eGFR: 88 mL/min/{1.73_m2} (ref >59)

## 2020-09-21 DIAGNOSIS — R002 Palpitations: Secondary | ICD-10-CM | POA: Diagnosis not present

## 2020-10-04 DIAGNOSIS — R002 Palpitations: Secondary | ICD-10-CM | POA: Diagnosis not present

## 2020-10-08 ENCOUNTER — Telehealth (HOSPITAL_COMMUNITY): Payer: Self-pay | Admitting: Emergency Medicine

## 2020-10-08 NOTE — Telephone Encounter (Signed)
Attempted to call patient regarding upcoming cardiac CT appointment. °Left message on voicemail with name and callback number °Dimonique Bourdeau RN Navigator Cardiac Imaging °[facility] °[phone] Office °[phone] Cell ° °

## 2020-10-10 ENCOUNTER — Ambulatory Visit (HOSPITAL_COMMUNITY)
Admission: RE | Admit: 2020-10-10 | Discharge: 2020-10-10 | Disposition: A | Discharge status: Home / Self Care | Payer: Medicare PPO | Source: Ambulatory Visit | Attending: Cardiovascular Disease | Admitting: Cardiovascular Disease

## 2020-10-10 ENCOUNTER — Other Ambulatory Visit: Payer: Self-pay

## 2020-10-10 DIAGNOSIS — R079 Chest pain, unspecified: Secondary | ICD-10-CM | POA: Insufficient documentation

## 2020-10-10 DIAGNOSIS — I4891 Unspecified atrial fibrillation: Secondary | ICD-10-CM

## 2020-10-10 DIAGNOSIS — I209 Angina pectoris, unspecified: Secondary | ICD-10-CM | POA: Diagnosis not present

## 2020-10-10 DIAGNOSIS — I2510 Atherosclerotic heart disease of native coronary artery without angina pectoris: Secondary | ICD-10-CM | POA: Diagnosis not present

## 2020-10-10 MED ORDER — NITROGLYCERIN 0.4 MG SL SUBL
0.8000 mg | SUBLINGUAL_TABLET | Freq: Once | SUBLINGUAL | Status: AC
  Administered 2020-10-10: 0.8 mg via SUBLINGUAL

## 2020-10-10 MED ORDER — NITROGLYCERIN 0.4 MG SL SUBL
SUBLINGUAL_TABLET | SUBLINGUAL | Status: AC
  Filled 2020-10-10: qty 2

## 2020-10-10 MED ORDER — IOHEXOL 350 MG/ML SOLN
95.0000 mL | Freq: Once | INTRAVENOUS | Status: AC | PRN
  Administered 2020-10-10: 95 mL via INTRAVENOUS

## 2020-10-10 MED ORDER — METOPROLOL SUCCINATE ER 25 MG PO TB24: 25.0000 mg | ORAL_TABLET | Freq: Every day | ORAL | 1 refills | Status: DC

## 2020-10-11 ENCOUNTER — Telehealth: Payer: Self-pay | Admitting: Cardiovascular Disease

## 2020-10-11 NOTE — Telephone Encounter (Signed)
Patient stated that someone had just call and he was returning call. Please advise

## 2020-10-11 NOTE — Telephone Encounter (Signed)
This RN called patient, verified pt's identity with name and date of birth. Patient stated he received a call from the office and that no message was left, states he was calling back. This RN did not see any call documented from earlier today, but asked patient if he had any concerns or questions. Pt asked about results from CT scan yesterday, pt had seen results on MyChart but had not seen a note from Dr. Carmon Ginsberg. This RN explained that Dr. Carmon Ginsberg had not provided a note yet and that a note is likely forthcoming. Pt verbalized understanding, all questions and concerns addressed at this time.

## 2020-10-17 ENCOUNTER — Other Ambulatory Visit: Payer: Self-pay | Admitting: Cardiovascular Disease

## 2020-10-18 DIAGNOSIS — G4733 Obstructive sleep apnea (adult) (pediatric): Secondary | ICD-10-CM | POA: Diagnosis not present

## 2020-10-19 DIAGNOSIS — G4733 Obstructive sleep apnea (adult) (pediatric): Secondary | ICD-10-CM | POA: Diagnosis not present

## 2020-11-01 DIAGNOSIS — R972 Elevated prostate specific antigen [PSA]: Secondary | ICD-10-CM | POA: Diagnosis not present

## 2020-11-01 DIAGNOSIS — R350 Frequency of micturition: Secondary | ICD-10-CM | POA: Diagnosis not present

## 2020-11-01 DIAGNOSIS — N401 Enlarged prostate with lower urinary tract symptoms: Secondary | ICD-10-CM | POA: Diagnosis not present

## 2020-11-14 ENCOUNTER — Ambulatory Visit (HOSPITAL_COMMUNITY): Payer: Medicare PPO | Attending: Cardiology

## 2020-11-14 ENCOUNTER — Other Ambulatory Visit: Payer: Self-pay

## 2020-11-14 DIAGNOSIS — I4891 Unspecified atrial fibrillation: Secondary | ICD-10-CM | POA: Insufficient documentation

## 2020-11-14 LAB — ECHOCARDIOGRAM COMPLETE
Area-P 1/2: 3.72 cm2
S' Lateral: 2.6 cm

## 2020-11-15 DIAGNOSIS — H401313 Pigmentary glaucoma, right eye, severe stage: Secondary | ICD-10-CM | POA: Diagnosis not present

## 2020-11-15 DIAGNOSIS — H401322 Pigmentary glaucoma, left eye, moderate stage: Secondary | ICD-10-CM | POA: Diagnosis not present

## 2020-11-30 DIAGNOSIS — D23122 Other benign neoplasm of skin of left lower eyelid, including canthus: Secondary | ICD-10-CM | POA: Diagnosis not present

## 2020-11-30 DIAGNOSIS — L821 Other seborrheic keratosis: Secondary | ICD-10-CM | POA: Diagnosis not present

## 2020-12-20 DIAGNOSIS — D229 Melanocytic nevi, unspecified: Secondary | ICD-10-CM | POA: Diagnosis not present

## 2020-12-20 DIAGNOSIS — L821 Other seborrheic keratosis: Secondary | ICD-10-CM | POA: Diagnosis not present

## 2020-12-20 DIAGNOSIS — I8393 Asymptomatic varicose veins of bilateral lower extremities: Secondary | ICD-10-CM | POA: Diagnosis not present

## 2020-12-20 DIAGNOSIS — L905 Scar conditions and fibrosis of skin: Secondary | ICD-10-CM | POA: Diagnosis not present

## 2020-12-20 DIAGNOSIS — L570 Actinic keratosis: Secondary | ICD-10-CM | POA: Diagnosis not present

## 2020-12-20 DIAGNOSIS — Z85828 Personal history of other malignant neoplasm of skin: Secondary | ICD-10-CM | POA: Diagnosis not present

## 2020-12-27 DIAGNOSIS — Z483 Aftercare following surgery for neoplasm: Secondary | ICD-10-CM | POA: Diagnosis not present

## 2020-12-27 DIAGNOSIS — D23122 Other benign neoplasm of skin of left lower eyelid, including canthus: Secondary | ICD-10-CM | POA: Diagnosis not present

## 2021-02-13 DIAGNOSIS — H401322 Pigmentary glaucoma, left eye, moderate stage: Secondary | ICD-10-CM | POA: Diagnosis not present

## 2021-02-13 DIAGNOSIS — H401313 Pigmentary glaucoma, right eye, severe stage: Secondary | ICD-10-CM | POA: Diagnosis not present

## 2021-03-20 DIAGNOSIS — H401322 Pigmentary glaucoma, left eye, moderate stage: Secondary | ICD-10-CM | POA: Diagnosis not present

## 2021-03-20 DIAGNOSIS — H401313 Pigmentary glaucoma, right eye, severe stage: Secondary | ICD-10-CM | POA: Diagnosis not present

## 2021-04-04 DIAGNOSIS — L814 Other melanin hyperpigmentation: Secondary | ICD-10-CM | POA: Diagnosis not present

## 2021-04-04 DIAGNOSIS — D225 Melanocytic nevi of trunk: Secondary | ICD-10-CM | POA: Diagnosis not present

## 2021-04-04 DIAGNOSIS — L570 Actinic keratosis: Secondary | ICD-10-CM | POA: Diagnosis not present

## 2021-04-04 DIAGNOSIS — L821 Other seborrheic keratosis: Secondary | ICD-10-CM | POA: Diagnosis not present

## 2021-04-25 DIAGNOSIS — N401 Enlarged prostate with lower urinary tract symptoms: Secondary | ICD-10-CM | POA: Diagnosis not present

## 2021-05-01 DIAGNOSIS — R350 Frequency of micturition: Secondary | ICD-10-CM | POA: Diagnosis not present

## 2021-05-01 DIAGNOSIS — N401 Enlarged prostate with lower urinary tract symptoms: Secondary | ICD-10-CM | POA: Diagnosis not present

## 2021-05-02 DIAGNOSIS — G4733 Obstructive sleep apnea (adult) (pediatric): Secondary | ICD-10-CM | POA: Diagnosis not present

## 2021-07-10 DIAGNOSIS — G4733 Obstructive sleep apnea (adult) (pediatric): Secondary | ICD-10-CM | POA: Diagnosis not present

## 2021-07-14 NOTE — Progress Notes (Signed)
Cardiology Office Note:   Date:  07/16/2021  NAME:  Raymond Petty    MRN: [id number] DOB:  [birth year]   PCP:  Mayra Neer, MD  Cardiologist:  None  Electrophysiologist:  None   Referring MD: Mayra Neer, MD   Chief Complaint  Patient presents with   Follow-up   History of Present Illness:   Raymond Petty is a 71 y.o. male with a hx of pAF, non-obstructive CAD who presents for follow-up. Seen last year for palpitations and chest pain. pAF found. Low burden <1%. Minimal CAD. He reports he is doing well.  He reports palpitations that occur once every 2 months.  Reports they can last up to 10 minutes.  Describes a racing heartbeat sensation.  Reports no chest pain or trouble breathing.  Still planning tennis without major limitations.  CTA negative for obstructive CAD.  Calcium score of 1.  He can withhold statin medications at this time.  Blood pressure slightly elevated today.  Reports values are well controlled at home.  No firm diagnosis of hypertension.  He otherwise is without complaints.  We reviewed all of his testing in office.  He has been diagnosed with sleep apnea.  Getting better readings on his machine as of lately.  Suspect this was a big contributor to atrial fibrillation.  He did not start metoprolol.  Likely not needed at this time.  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 Atrial fibrillation, paroxysmal -<1% burden  -CHADSVASC=1 (age) 3. RBBB 4. OSA 5. HLD -T chol 233, HDL 39, LDL 169, triglycerides 135  Past Medical History: Past Medical History:  Diagnosis Date   Glaucoma    pigmentary   Hyperlipidemia     Past Surgical History: Past Surgical History:  Procedure Laterality Date   APPENDECTOMY  1966   COLONOSCOPY  11/09   FLEXIBLE SIGMOIDOSCOPY  10/05   HAND SURGERY     R hand broken repair    TONSILLECTOMY  1972    Current Medications: Current Meds  Medication Sig   brimonidine (ALPHAGAN) 0.2 %  ophthalmic solution Place 1 drop into both eyes daily.   cromolyn (OPTICROM) 4 % ophthalmic solution Place 1 drop into the left eye 2 (two) times daily.    dorzolamide-timolol (COSOPT) 22.3-6.8 MG/ML ophthalmic solution Place 1 drop into both eyes 2 (two) times daily. Cosopt 22.3-6.8 MG/ML Solution 1 drop into affected eye Twice a day   latanoprost (XALATAN) 0.005 % ophthalmic solution Place 1 drop into the left eye at bedtime.   Multiple Vitamin (MULTIVITAMIN) tablet Take 1 tablet by mouth daily.   Netarsudil Dimesylate (RHOPRESSA) 0.02 % SOLN 1 drop into affected eye in the evening   Omega-3 Fatty Acids (FISH OIL PO) Take 1,000 mg by mouth daily. Fish Oil 1000 MG Capsule 1 capsule with a meal Once a day     Allergies:    Patient has no known allergies.   Social History: Social History   Socioeconomic History   Marital status: Married    Spouse name: Not on file   Number of children: 3   Years of education: Not on file   Highest education level: Not on file  Occupational History   Occupation: Attorney  Tobacco Use   Smoking status: Never   Smokeless tobacco: Never  Substance and Sexual Activity   Alcohol use: Yes   Drug use: Never   Sexual activity: Not on file  Other Topics Concern   Not on file  Social History Narrative   Not on file   Social Determinants of Health   Financial Resource Strain: Not on file  Food Insecurity: Not on file  Transportation Needs: Not on file  Physical Activity: Not on file  Stress: Not on file  Social Connections: Not on file     Family History: The patient's family history includes Heart attack in his father; Ovarian cancer in his mother; Schizophrenia in his sister; Stroke in his father.  ROS:   All other ROS reviewed and negative. Pertinent positives noted in the HPI.     EKGs/Labs/Other Studies Reviewed:   The following studies were personally reviewed by me today:  EKG:  EKG is ordered today.  The ekg ordered today demonstrates  normal sinus rhythm heart rate 78, right bundle branch block, and was personally reviewed by me.   CCTA 10/10/2020 IMPRESSION: 1. Significant arrhythmia artifact limits the sensitivity of this study to detect coronary artery disease.   2. Minimal CAD, CADRADS = 1.   3. Coronary calcium score is 1, which places the patient in the 20th percentile for age and sex matched control.   4. Normal coronary origin with right dominance.  Zio 09/13/2020 Impression: 1. Paroxysmal Atrial fibrillation detected (<1% burden). Episodes also may represent atrial flutter.  2. Rare ectopy.  TTE 11/14/2020  1. Left ventricular ejection fraction, by estimation, is 70 to 75%. The  left ventricle has hyperdynamic function. The left ventricle has no  regional wall motion abnormalities. Left ventricular diastolic parameters  are consistent with Grade II diastolic  dysfunction (pseudonormalization). The average left ventricular global  longitudinal strain is -24.6 %. The global longitudinal strain is normal.   2. Right ventricular systolic function is normal. The right ventricular  size is normal.   3. The mitral valve is normal in structure. Mild mitral valve  regurgitation. No evidence of mitral stenosis.   4. The aortic valve is calcified. There is mild calcification of the  aortic valve. There is mild thickening of the aortic valve. Aortic valve  regurgitation is trivial. Mild aortic valve sclerosis is present, with no  evidence of aortic valve stenosis.   5. The inferior vena cava is normal in size with greater than 50%  respiratory variability, suggesting right atrial pressure of 3 mmHg.  Recent Labs: 09/13/2020: BUN 14; Creatinine, Ser 0.95; Potassium 4.4; Sodium 142   Recent Lipid Panel No results found for: CHOL, TRIG, HDL, CHOLHDL, VLDL, LDLCALC, LDLDIRECT  Physical Exam:   VS:  BP (!) 150/80 (BP Location: Left Arm, Patient Position: Sitting, Cuff Size: Normal)    Pulse 78    Ht 6' (1.829 m)     Wt 205 lb 4.8 oz (93.1 kg)    BMI 27.84 kg/m    Wt Readings from Last 3 Encounters:  07/16/21 205 lb 4.8 oz (93.1 kg)  09/13/20 201 lb 9.6 oz (91.4 kg)    General: Well nourished, well developed, in no acute distress Head: Atraumatic, normal size  Eyes: PEERLA, EOMI  Neck: Supple, no JVD Endocrine: No thryomegaly Cardiac: Normal S1, S2; RRR; no murmurs, rubs, or gallops Lungs: Clear to auscultation bilaterally, no wheezing, rhonchi or rales  Abd: Soft, nontender, no hepatomegaly  Ext: No edema, pulses 2+ Musculoskeletal: No deformities, BUE and BLE strength normal and equal Skin: Warm and dry, no rashes   Neuro: Alert and oriented to person, place, time, and situation, CNII-XII grossly intact, no focal deficits  Psych: Normal mood and affect   ASSESSMENT:  Raymond Petty is a 71 y.o. male who presents for the following: 1. Paroxysmal atrial fibrillation (HCC)   2. Agatston CAC score, <100   3. Mixed hyperlipidemia   4. Elevated BP without diagnosis of hypertension     PLAN:   1. Paroxysmal atrial fibrillation (HCC) -Symptomatic paroxysmal atrial fibrillation found on monitor.  Seems to be well controlled on no medication.  We discussed treatment strategies.  He has been diagnosed with sleep apnea.  I think this was contributing.  His symptoms are occurring every few months.  For now he can forego any treatment and focus on treatment of sleep apnea.  I think this will reduce his A. fib burden.  He will let us know how things go.  If we need to reevaluate him for rhythm control strategy such as medications versus ablation we will.  For now we will continue with a conservative approach. -CHA2DS2-VASc score equals 1.  This is based on age.  BP slightly elevated but no formal diagnosis of hypertension.  His BP is elevated when he sees me back in 6 months he will merit anticoagulation. -Normal CCTA.  Normal echo.  Thyroid studies have been normal.  No reversible cause except for sleep  apnea.  2. Agatston CAC score, <100 3. Mixed hyperlipidemia -Coronary calcium score of 1.  I believe he can forego statin therapy at this time.  No evidence of obstructive CAD.  4. Elevated BP without diagnosis of hypertension -Close monitoring of this.  No firm diagnosis of hypertension.  We will keep a close eye on him.  He will need anticoagulation if he developed hypertension.   Disposition: Return in about 6 months (around 01/13/2022).  Medication Adjustments/Labs and Tests Ordered: Current medicines are reviewed at length with the patient today.  Concerns regarding medicines are outlined above.  Orders Placed This Encounter  Procedures   EKG 12-Lead   No orders of the defined types were placed in this encounter.   Patient Instructions  Medication Instructions:  The current medical regimen is effective;  continue present plan and medications.  *If you need a refill on your cardiac medications before your next appointment, please call your pharmacy*   Follow-Up: At [facility], you and your health needs are our priority.  As part of our continuing mission to provide you with exceptional heart care, we have created designated Provider Care Teams.  These Care Teams include your primary Cardiologist (physician) and Advanced Practice Providers (APPs -  Physician Assistants and Nurse Practitioners) who all work together to provide you with the care you need, when you need it.  We recommend signing up for the patient portal called "MyChart".  Sign up information is provided on this After Visit Summary.  MyChart is used to connect with patients for Virtual Visits (Telemedicine).  Patients are able to view lab/test results, encounter notes, upcoming appointments, etc.  Non-urgent messages can be sent to your provider as well.   To learn more about what you can do with MyChart, go to [email or web address].    Your next appointment:   6 month(s)  The format for your next  appointment:   In Person  Provider:   Eleonore Chiquito, MD      Time Spent with Patient: I have spent a total of 35 minutes with patient reviewing hospital notes, telemetry, EKGs, labs and examining the patient as well as establishing an assessment and plan that was discussed with the patient.  > 50% of time was spent in direct  patient care.  Signed, Addison Naegeli. Audie Box, MD, [facility]  [address] [place], [place] [address] [phone]  07/16/2021 4:58 PM

## 2021-07-16 ENCOUNTER — Other Ambulatory Visit: Payer: Self-pay

## 2021-07-16 ENCOUNTER — Encounter (HOSPITAL_BASED_OUTPATIENT_CLINIC_OR_DEPARTMENT_OTHER): Payer: Self-pay | Admitting: Cardiovascular Disease

## 2021-07-16 ENCOUNTER — Ambulatory Visit (HOSPITAL_BASED_OUTPATIENT_CLINIC_OR_DEPARTMENT_OTHER): Payer: Medicare PPO | Admitting: Cardiovascular Disease

## 2021-07-16 VITALS — BP 150/80 | HR 78 | Ht 72.0 in | Wt 205.3 lb

## 2021-07-16 DIAGNOSIS — R931 Abnormal findings on diagnostic imaging of heart and coronary circulation: Secondary | ICD-10-CM | POA: Diagnosis not present

## 2021-07-16 DIAGNOSIS — R030 Elevated blood-pressure reading, without diagnosis of hypertension: Secondary | ICD-10-CM

## 2021-07-16 DIAGNOSIS — E782 Mixed hyperlipidemia: Secondary | ICD-10-CM

## 2021-07-16 DIAGNOSIS — I480 Paroxysmal atrial fibrillation: Secondary | ICD-10-CM

## 2021-07-16 NOTE — Patient Instructions (Signed)
Medication Instructions:  The current medical regimen is effective;  continue present plan and medications.  *If you need a refill on your cardiac medications before your next appointment, please call your pharmacy*   Follow-Up: At [facility], you and your health needs are our priority.  As part of our continuing mission to provide you with exceptional heart care, we have created designated Provider Care Teams.  These Care Teams include your primary Cardiologist (physician) and Advanced Practice Providers (APPs -  Physician Assistants and Nurse Practitioners) who all work together to provide you with the care you need, when you need it.  We recommend signing up for the patient portal called "MyChart".  Sign up information is provided on this After Visit Summary.  MyChart is used to connect with patients for Virtual Visits (Telemedicine).  Patients are able to view lab/test results, encounter notes, upcoming appointments, etc.  Non-urgent messages can be sent to your provider as well.   To learn more about what you can do with MyChart, go to [email or web address].    Your next appointment:   6 month(s)  The format for your next appointment:   In Person  Provider:   [place] O'Neal, MD      

## 2021-08-05 DIAGNOSIS — D1801 Hemangioma of skin and subcutaneous tissue: Secondary | ICD-10-CM | POA: Diagnosis not present

## 2021-08-05 DIAGNOSIS — L814 Other melanin hyperpigmentation: Secondary | ICD-10-CM | POA: Diagnosis not present

## 2021-08-05 DIAGNOSIS — L821 Other seborrheic keratosis: Secondary | ICD-10-CM | POA: Diagnosis not present

## 2021-08-05 DIAGNOSIS — Z85828 Personal history of other malignant neoplasm of skin: Secondary | ICD-10-CM | POA: Diagnosis not present

## 2021-08-05 DIAGNOSIS — L820 Inflamed seborrheic keratosis: Secondary | ICD-10-CM | POA: Diagnosis not present

## 2021-08-05 DIAGNOSIS — D225 Melanocytic nevi of trunk: Secondary | ICD-10-CM | POA: Diagnosis not present

## 2021-08-05 DIAGNOSIS — L578 Other skin changes due to chronic exposure to nonionizing radiation: Secondary | ICD-10-CM | POA: Diagnosis not present

## 2021-08-14 DIAGNOSIS — H401313 Pigmentary glaucoma, right eye, severe stage: Secondary | ICD-10-CM | POA: Diagnosis not present

## 2021-08-14 DIAGNOSIS — H35351 Cystoid macular degeneration, right eye: Secondary | ICD-10-CM | POA: Diagnosis not present

## 2021-08-14 DIAGNOSIS — H401322 Pigmentary glaucoma, left eye, moderate stage: Secondary | ICD-10-CM | POA: Diagnosis not present

## 2021-09-13 DIAGNOSIS — R972 Elevated prostate specific antigen [PSA]: Secondary | ICD-10-CM | POA: Diagnosis not present

## 2021-09-13 DIAGNOSIS — Z23 Encounter for immunization: Secondary | ICD-10-CM | POA: Diagnosis not present

## 2021-09-13 DIAGNOSIS — M25562 Pain in left knee: Secondary | ICD-10-CM | POA: Diagnosis not present

## 2021-09-13 DIAGNOSIS — Z0000 Encounter for general adult medical examination without abnormal findings: Secondary | ICD-10-CM | POA: Diagnosis not present

## 2021-09-13 DIAGNOSIS — H409 Unspecified glaucoma: Secondary | ICD-10-CM | POA: Diagnosis not present

## 2021-09-13 DIAGNOSIS — G4733 Obstructive sleep apnea (adult) (pediatric): Secondary | ICD-10-CM | POA: Diagnosis not present

## 2021-09-13 DIAGNOSIS — E7800 Pure hypercholesterolemia, unspecified: Secondary | ICD-10-CM | POA: Diagnosis not present

## 2021-09-13 DIAGNOSIS — I480 Paroxysmal atrial fibrillation: Secondary | ICD-10-CM | POA: Diagnosis not present

## 2021-09-30 DIAGNOSIS — H401322 Pigmentary glaucoma, left eye, moderate stage: Secondary | ICD-10-CM | POA: Diagnosis not present

## 2021-09-30 DIAGNOSIS — H401313 Pigmentary glaucoma, right eye, severe stage: Secondary | ICD-10-CM | POA: Diagnosis not present

## 2021-10-15 DIAGNOSIS — H0100B Unspecified blepharitis left eye, upper and lower eyelids: Secondary | ICD-10-CM | POA: Diagnosis not present

## 2021-10-21 DIAGNOSIS — H401322 Pigmentary glaucoma, left eye, moderate stage: Secondary | ICD-10-CM | POA: Diagnosis not present

## 2021-10-21 DIAGNOSIS — H401313 Pigmentary glaucoma, right eye, severe stage: Secondary | ICD-10-CM | POA: Diagnosis not present

## 2021-10-23 DIAGNOSIS — G4733 Obstructive sleep apnea (adult) (pediatric): Secondary | ICD-10-CM | POA: Diagnosis not present

## 2021-11-27 DIAGNOSIS — G4733 Obstructive sleep apnea (adult) (pediatric): Secondary | ICD-10-CM | POA: Diagnosis not present

## 2021-12-13 DIAGNOSIS — H401313 Pigmentary glaucoma, right eye, severe stage: Secondary | ICD-10-CM | POA: Diagnosis not present

## 2021-12-13 DIAGNOSIS — H401322 Pigmentary glaucoma, left eye, moderate stage: Secondary | ICD-10-CM | POA: Diagnosis not present

## 2021-12-18 DIAGNOSIS — H401313 Pigmentary glaucoma, right eye, severe stage: Secondary | ICD-10-CM | POA: Diagnosis not present

## 2021-12-18 DIAGNOSIS — H401322 Pigmentary glaucoma, left eye, moderate stage: Secondary | ICD-10-CM | POA: Diagnosis not present

## 2022-01-03 ENCOUNTER — Ambulatory Visit
Admission: RE | Admit: 2022-01-03 | Discharge: 2022-01-03 | Disposition: A | Discharge status: Home / Self Care | Payer: Medicare PPO | Source: Ambulatory Visit | Attending: Family Medicine | Admitting: Family Medicine

## 2022-01-03 ENCOUNTER — Other Ambulatory Visit: Payer: Self-pay | Admitting: Family Medicine

## 2022-01-03 DIAGNOSIS — M1712 Unilateral primary osteoarthritis, left knee: Secondary | ICD-10-CM | POA: Diagnosis not present

## 2022-01-03 DIAGNOSIS — M25562 Pain in left knee: Secondary | ICD-10-CM | POA: Diagnosis not present

## 2022-01-08 DIAGNOSIS — G4733 Obstructive sleep apnea (adult) (pediatric): Secondary | ICD-10-CM | POA: Diagnosis not present

## 2022-01-16 DIAGNOSIS — M25562 Pain in left knee: Secondary | ICD-10-CM | POA: Diagnosis not present

## 2022-02-06 DIAGNOSIS — M25562 Pain in left knee: Secondary | ICD-10-CM | POA: Diagnosis not present

## 2022-02-13 DIAGNOSIS — B078 Other viral warts: Secondary | ICD-10-CM | POA: Diagnosis not present

## 2022-02-13 DIAGNOSIS — L821 Other seborrheic keratosis: Secondary | ICD-10-CM | POA: Diagnosis not present

## 2022-02-13 DIAGNOSIS — L820 Inflamed seborrheic keratosis: Secondary | ICD-10-CM | POA: Diagnosis not present

## 2022-02-17 DIAGNOSIS — M25562 Pain in left knee: Secondary | ICD-10-CM | POA: Diagnosis not present

## 2022-02-19 DIAGNOSIS — M25562 Pain in left knee: Secondary | ICD-10-CM | POA: Diagnosis not present

## 2022-02-20 ENCOUNTER — Other Ambulatory Visit: Payer: Self-pay | Admitting: Sports Medicine

## 2022-02-20 DIAGNOSIS — M25562 Pain in left knee: Secondary | ICD-10-CM

## 2022-03-11 ENCOUNTER — Ambulatory Visit
Admission: RE | Admit: 2022-03-11 | Discharge: 2022-03-11 | Disposition: A | Discharge status: Home / Self Care | Payer: Medicare PPO | Source: Ambulatory Visit | Attending: Sports Medicine | Admitting: Sports Medicine

## 2022-03-11 DIAGNOSIS — M25562 Pain in left knee: Secondary | ICD-10-CM | POA: Diagnosis not present

## 2022-03-19 DIAGNOSIS — G4733 Obstructive sleep apnea (adult) (pediatric): Secondary | ICD-10-CM | POA: Diagnosis not present

## 2022-04-12 DIAGNOSIS — H401322 Pigmentary glaucoma, left eye, moderate stage: Secondary | ICD-10-CM | POA: Diagnosis not present

## 2022-04-12 DIAGNOSIS — H401313 Pigmentary glaucoma, right eye, severe stage: Secondary | ICD-10-CM | POA: Diagnosis not present

## 2022-04-24 DIAGNOSIS — L578 Other skin changes due to chronic exposure to nonionizing radiation: Secondary | ICD-10-CM | POA: Diagnosis not present

## 2022-04-24 DIAGNOSIS — L649 Androgenic alopecia, unspecified: Secondary | ICD-10-CM | POA: Diagnosis not present

## 2022-04-24 DIAGNOSIS — L570 Actinic keratosis: Secondary | ICD-10-CM | POA: Diagnosis not present

## 2022-04-24 DIAGNOSIS — D485 Neoplasm of uncertain behavior of skin: Secondary | ICD-10-CM | POA: Diagnosis not present

## 2022-05-13 DIAGNOSIS — N401 Enlarged prostate with lower urinary tract symptoms: Secondary | ICD-10-CM | POA: Diagnosis not present

## 2022-05-13 DIAGNOSIS — J019 Acute sinusitis, unspecified: Secondary | ICD-10-CM | POA: Diagnosis not present

## 2022-05-13 DIAGNOSIS — R059 Cough, unspecified: Secondary | ICD-10-CM | POA: Diagnosis not present

## 2022-05-22 DIAGNOSIS — R350 Frequency of micturition: Secondary | ICD-10-CM | POA: Diagnosis not present

## 2022-05-22 DIAGNOSIS — N401 Enlarged prostate with lower urinary tract symptoms: Secondary | ICD-10-CM | POA: Diagnosis not present

## 2022-06-24 DIAGNOSIS — M1712 Unilateral primary osteoarthritis, left knee: Secondary | ICD-10-CM | POA: Diagnosis not present

## 2022-06-24 DIAGNOSIS — M25562 Pain in left knee: Secondary | ICD-10-CM | POA: Diagnosis not present

## 2022-07-01 NOTE — Progress Notes (Signed)
Cardiology Office Note:   Date:  07/04/2022  NAME:  Raymond Petty    MRN: [id number] DOB:  02/26/1951   PCP:  Mayra Neer, MD  Cardiologist:  None  Electrophysiologist:  None   Referring MD: Mayra Neer, MD   Chief Complaint  Patient presents with   Follow-up    6 months.   History of Present Illness:   Raymond Petty is a 72 y.o. male with a hx of pAF, non-obstructive CAD, HLD who presents for follow-up.  He reports he is doing well.  May have intermittent palpitations.  No sustained episodes.  Now being treated for sleep apnea.  Blood pressure is well-controlled.  LDL still elevated but calcium score 1.  His percentile is 20th.  He wishes to avoid statin medication.  He is not exercising due to left knee pain.  Underwent injections.  Wanting to avoid knee replacement.  CV examination normal.  No symptoms of angina.  Overall seems to be doing well from a cardiovascular standpoint.  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 Atrial fibrillation, paroxysmal -<1% burden  -CHADSVASC=1 (age) 3. RBBB 4. OSA -using CPAP 5. HLD -T chol 216, HDL 41, LDL 156, triglycerides 107  Past Medical History: Past Medical History:  Diagnosis Date   Glaucoma    pigmentary   Hyperlipidemia     Past Surgical History: Past Surgical History:  Procedure Laterality Date   APPENDECTOMY  1966   COLONOSCOPY  11/09   FLEXIBLE SIGMOIDOSCOPY  10/05   HAND SURGERY     R hand broken repair    TONSILLECTOMY  1972    Current Medications: Current Meds  Medication Sig   cromolyn (OPTICROM) 4 % ophthalmic solution Place 1 drop into the left eye 2 (two) times daily.    dorzolamide-timolol (COSOPT) 22.3-6.8 MG/ML ophthalmic solution Place 1 drop into both eyes 2 (two) times daily. Cosopt 22.3-6.8 MG/ML Solution 1 drop into affected eye Twice a day   latanoprost (XALATAN) 0.005 % ophthalmic solution Place 1 drop into the left eye at bedtime.   Multiple  Vitamin (MULTIVITAMIN) tablet Take 1 tablet by mouth daily.   Omega-3 Fatty Acids (FISH OIL PO) Take 1,000 mg by mouth daily. Fish Oil 1000 MG Capsule 1 capsule with a meal Once a day     Allergies:    Patient has no known allergies.   Social History: Social History   Socioeconomic History   Marital status: Married    Spouse name: Not on file   Number of children: 3   Years of education: Not on file   Highest education level: Not on file  Occupational History   Occupation: Attorney  Tobacco Use   Smoking status: Never   Smokeless tobacco: Never  Substance and Sexual Activity   Alcohol use: Yes   Drug use: Never   Sexual activity: Not on file  Other Topics Concern   Not on file  Social History Narrative   Not on file   Social Determinants of Health   Financial Resource Strain: Not on file  Food Insecurity: Not on file  Transportation Needs: Not on file  Physical Activity: Not on file  Stress: Not on file  Social Connections: Not on file     Family History: The patient's family history includes Heart attack in his father; Ovarian cancer in his mother; Schizophrenia in his sister; Stroke in his father.  ROS:   All other ROS reviewed and negative. Pertinent positives noted  in the HPI.     EKGs/Labs/Other Studies Reviewed:   The following studies were personally reviewed by me today:  EKG:  EKG is ordered today.  The ekg ordered today demonstrates normal sinus rhythm heart rate 77, right bundle branch block, PAC, and was personally reviewed by me.   Recent Labs: No results found for requested labs within last 365 days.   Recent Lipid Panel No results found for: "CHOL", "TRIG", "HDL", "CHOLHDL", "VLDL", "LDLCALC", "LDLDIRECT"  Physical Exam:   VS:  BP 128/72 (BP Location: Right Arm, Patient Position: Sitting, Cuff Size: Normal)   Pulse 77   Ht 5\' 11"  (1.803 m)   Wt 207 lb (93.9 kg)   BMI 28.87 kg/m    Wt Readings from Last 3 Encounters:  07/04/22 207 lb  (93.9 kg)  07/16/21 205 lb 4.8 oz (93.1 kg)  09/13/20 201 lb 9.6 oz (91.4 kg)    General: Well nourished, well developed, in no acute distress Head: Atraumatic, normal size  Eyes: PEERLA, EOMI  Neck: Supple, no JVD Endocrine: No thryomegaly Cardiac: Normal S1, S2; RRR; no murmurs, rubs, or gallops Lungs: Clear to auscultation bilaterally, no wheezing, rhonchi or rales  Abd: Soft, nontender, no hepatomegaly  Ext: No edema, pulses 2+ Musculoskeletal: No deformities, BUE and BLE strength normal and equal Skin: Warm and dry, no rashes   Neuro: Alert and oriented to person, place, time, and situation, CNII-XII grossly intact, no focal deficits  Psych: Normal mood and affect   ASSESSMENT:   Raymond Petty is a 72 y.o. male who presents for the following: 1. Paroxysmal atrial fibrillation (HCC)   2. Agatston CAC score, <100   3. Mixed hyperlipidemia     PLAN:   1. Paroxysmal atrial fibrillation (HCC) -Paroxysmal A-fib.  Less than 1% burden.  CHA2DS2-VASc equals 1.  Anticoagulation not indicated.  Suspect his trigger is sleep apnea.  He will continue using his CPAP machine.  On no medication.  Doing well.  2. Agatston CAC score, <100 3. Mixed hyperlipidemia -Coronary calcium score of 1.  20 percentile.  Wishes to avoid statin medication at this time.  This is okay.  Would repeat calcium scoring in 5 years.  LDL is 156.      Disposition: Return in about 1 year (around 07/05/2023).  Medication Adjustments/Labs and Tests Ordered: Current medicines are reviewed at length with the patient today.  Concerns regarding medicines are outlined above.  Orders Placed This Encounter  Procedures   EKG 12-Lead   No orders of the defined types were placed in this encounter.   Patient Instructions  Medication Instructions:  The current medical regimen is effective;  continue present plan and medications.  *If you need a refill on your cardiac medications before your next appointment, please  call your pharmacy*   Follow-Up: At [facility], you and your health needs are our priority.  As part of our continuing mission to provide you with exceptional heart care, we have created designated Provider Care Teams.  These Care Teams include your primary Cardiologist (physician) and Advanced Practice Providers (APPs -  Physician Assistants and Nurse Practitioners) who all work together to provide you with the care you need, when you need it.  We recommend signing up for the patient portal called "MyChart".  Sign up information is provided on this After Visit Summary.  MyChart is used to connect with patients for Virtual Visits (Telemedicine).  Patients are able to view lab/test results, encounter notes, upcoming appointments, etc.  Non-urgent messages can be sent to your provider as well.   To learn more about what you can do with MyChart, go to [email or web address].    Your next appointment:   12 month(s)  Provider:   Lennie Odor, MD    Time Spent with Patient: I have spent a total of 25 minutes with patient reviewing hospital notes, telemetry, EKGs, labs and examining the patient as well as establishing an assessment and plan that was discussed with the patient.  > 50% of time was spent in direct patient care.  Signed, Lenna Gilford. Flora Lipps, MD, [facility]  [facility]  [address], [address] [place], [place] [address] [phone]  07/04/2022 10:19 AM

## 2022-07-04 ENCOUNTER — Encounter: Payer: Self-pay | Admitting: Cardiovascular Disease

## 2022-07-04 ENCOUNTER — Ambulatory Visit: Payer: Medicare PPO | Attending: Cardiovascular Disease | Admitting: Cardiovascular Disease

## 2022-07-04 VITALS — BP 128/72 | HR 77 | Ht 71.0 in | Wt 207.0 lb

## 2022-07-04 DIAGNOSIS — R931 Abnormal findings on diagnostic imaging of heart and coronary circulation: Secondary | ICD-10-CM

## 2022-07-04 DIAGNOSIS — I480 Paroxysmal atrial fibrillation: Secondary | ICD-10-CM | POA: Diagnosis not present

## 2022-07-04 DIAGNOSIS — E782 Mixed hyperlipidemia: Secondary | ICD-10-CM | POA: Diagnosis not present

## 2022-07-04 NOTE — Patient Instructions (Signed)
Medication Instructions:  The current medical regimen is effective;  continue present plan and medications.  *If you need a refill on your cardiac medications before your next appointment, please call your pharmacy*   Follow-Up: At [facility], you and your health needs are our priority.  As part of our continuing mission to provide you with exceptional heart care, we have created designated Provider Care Teams.  These Care Teams include your primary Cardiologist (physician) and Advanced Practice Providers (APPs -  Physician Assistants and Nurse Practitioners) who all work together to provide you with the care you need, when you need it.  We recommend signing up for the patient portal called "MyChart".  Sign up information is provided on this After Visit Summary.  MyChart is used to connect with patients for Virtual Visits (Telemedicine).  Patients are able to view lab/test results, encounter notes, upcoming appointments, etc.  Non-urgent messages can be sent to your provider as well.   To learn more about what you can do with MyChart, go to [email or web address].    Your next appointment:   12 month(s)  Provider:   [place] O'Neal, MD   

## 2022-07-17 DIAGNOSIS — H401322 Pigmentary glaucoma, left eye, moderate stage: Secondary | ICD-10-CM | POA: Diagnosis not present

## 2022-07-17 DIAGNOSIS — H401313 Pigmentary glaucoma, right eye, severe stage: Secondary | ICD-10-CM | POA: Diagnosis not present

## 2022-07-31 DIAGNOSIS — I480 Paroxysmal atrial fibrillation: Secondary | ICD-10-CM | POA: Diagnosis not present

## 2022-07-31 DIAGNOSIS — G4733 Obstructive sleep apnea (adult) (pediatric): Secondary | ICD-10-CM | POA: Diagnosis not present

## 2022-08-01 DIAGNOSIS — U071 COVID-19: Secondary | ICD-10-CM | POA: Diagnosis not present

## 2022-08-07 DIAGNOSIS — G4733 Obstructive sleep apnea (adult) (pediatric): Secondary | ICD-10-CM | POA: Diagnosis not present

## 2022-08-11 DIAGNOSIS — M1712 Unilateral primary osteoarthritis, left knee: Secondary | ICD-10-CM | POA: Diagnosis not present

## 2022-08-15 ENCOUNTER — Telehealth: Payer: Self-pay | Admitting: Cardiovascular Disease

## 2022-08-15 NOTE — Telephone Encounter (Signed)
LMTCB

## 2022-08-15 NOTE — Telephone Encounter (Addendum)
Pt stated that this past Wednesday, before his 25 minute workout, his heart rate was 150. It never went above 155 during workout. On Thursday, the same thing before his workout. Other than that, his pulse has been in the 80s. He was asymptomatic during the tachycardic episodes. His concern is that he has been taking Meloxicam 15 mg daily for 7 weeks and believes that is what is causing tachycardia. His is still recovering from COVID/paxlovid from 07/31/22. No signs of bleeding. Please advise.

## 2022-08-15 NOTE — Telephone Encounter (Signed)
STAT if HR is under 50 or over 120 (normal HR is 60-100 beats per minute)  What is your heart rate? 150 (yestrday)  63, 71 (this morning)  Do you have a log of your heart rate readings (document readings)?    Do you have any other symptoms? Patient states that he has been having rapid HR for the last 36 hrs.

## 2022-08-15 NOTE — Telephone Encounter (Signed)
Called patient, advised of message from MD.   Patient will continue to monitor and will keep [place] updated.   Patient verbalized understanding

## 2022-09-10 DIAGNOSIS — D1801 Hemangioma of skin and subcutaneous tissue: Secondary | ICD-10-CM | POA: Diagnosis not present

## 2022-09-10 DIAGNOSIS — Z85828 Personal history of other malignant neoplasm of skin: Secondary | ICD-10-CM | POA: Diagnosis not present

## 2022-09-10 DIAGNOSIS — L649 Androgenic alopecia, unspecified: Secondary | ICD-10-CM | POA: Diagnosis not present

## 2022-09-10 DIAGNOSIS — L570 Actinic keratosis: Secondary | ICD-10-CM | POA: Diagnosis not present

## 2022-09-10 DIAGNOSIS — L820 Inflamed seborrheic keratosis: Secondary | ICD-10-CM | POA: Diagnosis not present

## 2022-09-10 DIAGNOSIS — L821 Other seborrheic keratosis: Secondary | ICD-10-CM | POA: Diagnosis not present

## 2022-09-10 DIAGNOSIS — L853 Xerosis cutis: Secondary | ICD-10-CM | POA: Diagnosis not present

## 2022-09-23 DIAGNOSIS — R7301 Impaired fasting glucose: Secondary | ICD-10-CM | POA: Diagnosis not present

## 2022-09-23 DIAGNOSIS — I480 Paroxysmal atrial fibrillation: Secondary | ICD-10-CM | POA: Diagnosis not present

## 2022-09-23 DIAGNOSIS — M25562 Pain in left knee: Secondary | ICD-10-CM | POA: Diagnosis not present

## 2022-09-23 DIAGNOSIS — H409 Unspecified glaucoma: Secondary | ICD-10-CM | POA: Diagnosis not present

## 2022-09-23 DIAGNOSIS — R972 Elevated prostate specific antigen [PSA]: Secondary | ICD-10-CM | POA: Diagnosis not present

## 2022-09-23 DIAGNOSIS — Z23 Encounter for immunization: Secondary | ICD-10-CM | POA: Diagnosis not present

## 2022-09-23 DIAGNOSIS — E7800 Pure hypercholesterolemia, unspecified: Secondary | ICD-10-CM | POA: Diagnosis not present

## 2022-09-23 DIAGNOSIS — G4733 Obstructive sleep apnea (adult) (pediatric): Secondary | ICD-10-CM | POA: Diagnosis not present

## 2022-09-23 DIAGNOSIS — Z0000 Encounter for general adult medical examination without abnormal findings: Secondary | ICD-10-CM | POA: Diagnosis not present

## 2022-09-23 LAB — LAB REPORT - SCANNED
A1c: 5.7
EGFR: 96

## 2022-09-29 DIAGNOSIS — M1712 Unilateral primary osteoarthritis, left knee: Secondary | ICD-10-CM | POA: Diagnosis not present

## 2022-10-02 IMAGING — CT CT HEART MORP W/ CTA COR W/ SCORE W/ CA W/CM &/OR W/O CM
4 of 7 series · 8 of 20 positions shown, 9 images · IV contrast (APPLIED)
Comparison: None.
COMPARISON: None.

Addendum:
EXAM:
OVER-READ INTERPRETATION  CT CHEST

The following report is an over-read performed by radiologist Dr.
Rtoyota Joshjax [REDACTED] on 10/10/2020. This
over-read does not include interpretation of cardiac or coronary
anatomy or pathology. The coronary calcium score/coronary CTA
interpretation by the cardiologist is attached.
HISTORY: Chest pain/anginal equiv, 10yr CHD risk 10-20%, not treadmill
candidate
Cardiac/Coronary  CT
TECHNIQUE: The patient was scanned on a Siemens Force scanner.
PROTOCOL: A 120 kV prospective scan was triggered in the descending thoracic
aorta at 111 HU's. Axial non-contrast 3 mm slices were carried out
through the heart. The data set was analyzed on a dedicated work
station and scored using the Agatston method. Gantry rotation speed
was 250 msecs and collimation was .6 mm. Beta blockade and 0.8 mg of
sl NTG was given. The 3D data set was reconstructed in 5% intervals
of the 35-75 % of the R-R cycle. Systolic and diastolic phases were
analyzed on a dedicated work station using MPR, MIP and VRT modes.
The patient received 95mL OMNIPAQUE IOHEXOL 350 MG/ML SOLN contrast.

[Series 7: best diast 74 % · axial · 0.39mm/px · z∈[+1118,+1165]mm · 2 of 353 slices shown, 3 images]
[im 118/353  vessel]
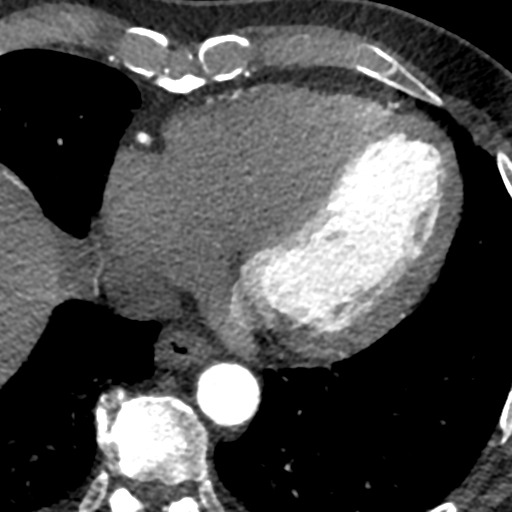
[im 118/353  lung]
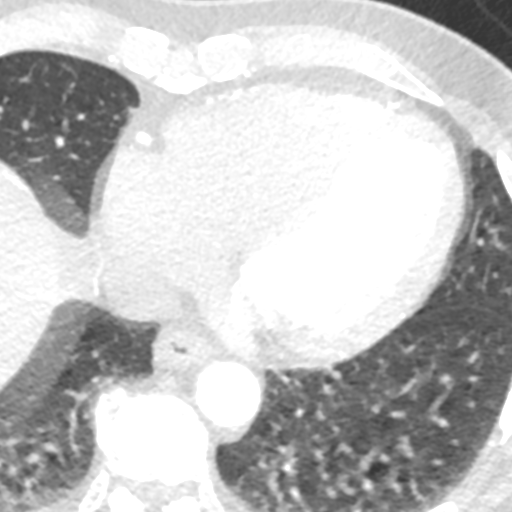
[im 235/353  vessel]
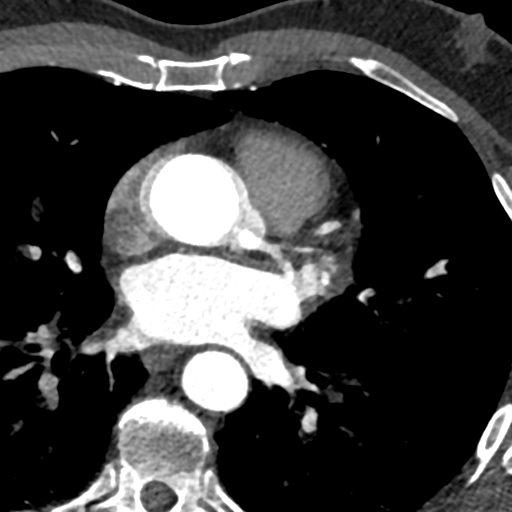

[Series 8: best syst 34 % · axial · 0.39mm/px · z∈[+1118,+1165]mm · 2 of 353 slices shown]
[im 118/353  vessel]
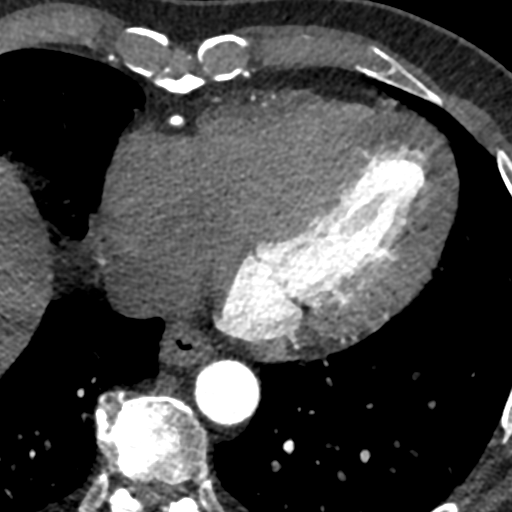
[im 235/353  vessel]
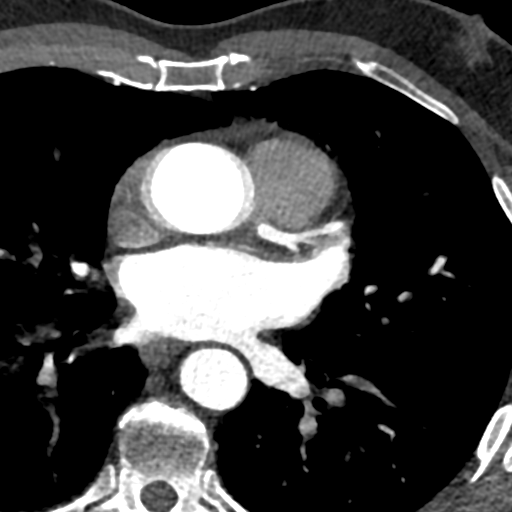

[Series 9: ts diast sharp 74 % · axial · 0.39mm/px · z∈[+1118,+1165]mm · 2 of 353 slices shown]
[im 118/353  lung]
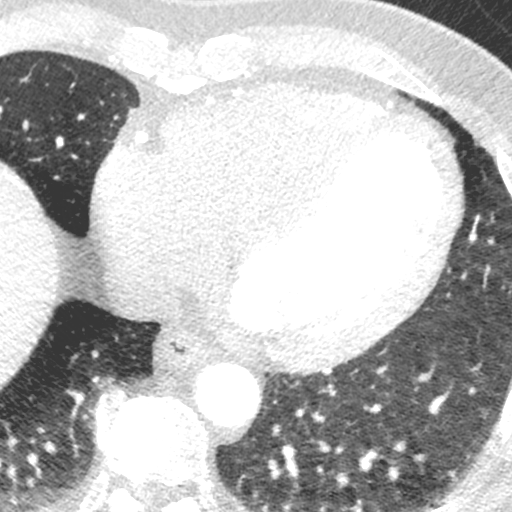
[im 235/353  lung]
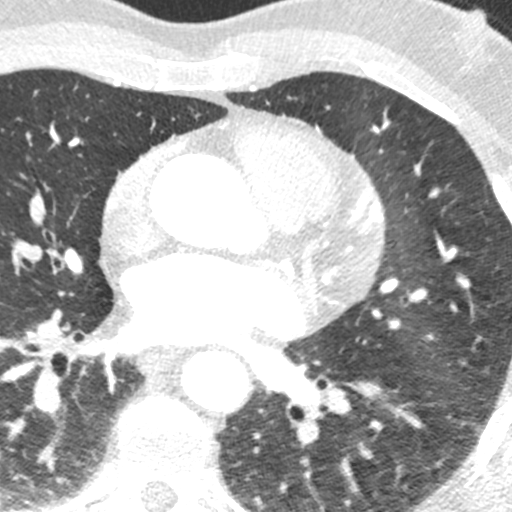

[Series 10: ts syst sharp 34 % · axial · 0.39mm/px · z∈[+1118,+1165]mm · 2 of 353 slices shown]
[im 118/353  lung]
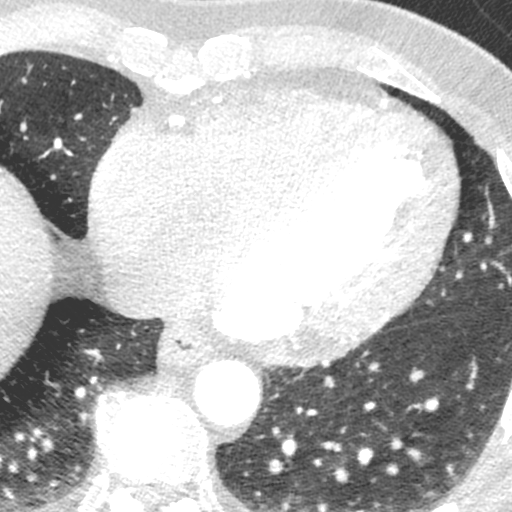
[im 235/353  lung]
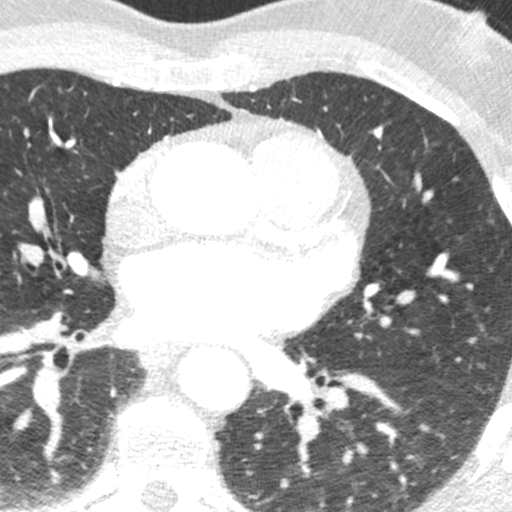

[8 of 20 positions shown; findings below may reference images not displayed]

FINDINGS: Within the visualized portions of the thorax there are no suspicious
appearing pulmonary nodules or masses, there is no acute
consolidative airspace disease, no pleural effusions, no
pneumothorax and no lymphadenopathy. Visualized portions of the
upper abdomen demonstrates a 1.2 cm low-attenuation lesion in
segment 2 of the liver, compatible with a simple cyst. There are no
aggressive appearing lytic or blastic lesions noted in the
visualized portions of the skeleton.
IMPRESSION: 1. No significant incidental noncardiac findings are noted.
FINDINGS: Image quality: Adequate.

Noise artifact is: Severe, arrhythmia artifact with slice
misalignment.

Coronary calcium score is 1, which places the patient in the 20th
percentile for age and sex matched control.

Coronary arteries: Normal coronary origins.  Right dominance.

Right Coronary Artery: Minimal mixed atherosclerotic plaque in the
distal RCA, <25% stenosis.

Left Main Coronary Artery: The LMCA is significantly impacted by
motion artifact. No apparent plaque or stenosis. Best evaluated at
35% and 75% R-R.

Left Anterior Descending Coronary Artery: The proximal and mid LAD
are significantly impacted by motion artifact. The proximal LAD is
best evaluated at 75% R-R with no apparent plaque or stenosis. The
mid LAD is best evaluated at 35% R-R, minimal atherosclerotic
plaque, <25% stenosis.

Left Circumflex Artery: The proximal L circumflex is significantly
impacted by motion artifact. The L circumflex is best evaluated at
35% R-R interval, no detectable plaque or stenosis.

Aorta: 38 mm at the sinus of Valsalva, likely normal range for age
and BSA.

35 mm at the mid ascending aorta (level of the PA bifurcation)
measured double oblique. No calcifications. No dissection.

Aortic Valve: Trivial calcifications, mild leaflet thickening.

Other findings:

Normal pulmonary vein drainage into the left atrium.

Normal left atrial appendage without a thrombus.

Normal size of the pulmonary artery.
IMPRESSION: 1. Significant arrhythmia artifact limits the sensitivity of this
study to detect coronary artery disease.

2. Minimal CAD, CADRADS = 1.

3. Coronary calcium score is 1, which places the patient in the 20th
percentile for age and sex matched control.

4. Normal coronary origin with right dominance.

*** End of Addendum ***
EXAM:
OVER-READ INTERPRETATION  CT CHEST

The following report is an over-read performed by radiologist Dr.
Rtoyota Joshjax [REDACTED] on 10/10/2020. This
over-read does not include interpretation of cardiac or coronary
anatomy or pathology. The coronary calcium score/coronary CTA
interpretation by the cardiologist is attached.
FINDINGS: Within the visualized portions of the thorax there are no suspicious
appearing pulmonary nodules or masses, there is no acute
consolidative airspace disease, no pleural effusions, no
pneumothorax and no lymphadenopathy. Visualized portions of the
upper abdomen demonstrates a 1.2 cm low-attenuation lesion in
segment 2 of the liver, compatible with a simple cyst. There are no
aggressive appearing lytic or blastic lesions noted in the
visualized portions of the skeleton.
IMPRESSION: 1. No significant incidental noncardiac findings are noted.

## 2022-10-16 DIAGNOSIS — H401313 Pigmentary glaucoma, right eye, severe stage: Secondary | ICD-10-CM | POA: Diagnosis not present

## 2022-10-16 DIAGNOSIS — H401322 Pigmentary glaucoma, left eye, moderate stage: Secondary | ICD-10-CM | POA: Diagnosis not present

## 2022-10-27 DIAGNOSIS — H401322 Pigmentary glaucoma, left eye, moderate stage: Secondary | ICD-10-CM | POA: Diagnosis not present

## 2022-10-27 DIAGNOSIS — H401313 Pigmentary glaucoma, right eye, severe stage: Secondary | ICD-10-CM | POA: Diagnosis not present

## 2022-11-10 DIAGNOSIS — M1712 Unilateral primary osteoarthritis, left knee: Secondary | ICD-10-CM | POA: Diagnosis not present

## 2022-11-19 DIAGNOSIS — T1512XA Foreign body in conjunctival sac, left eye, initial encounter: Secondary | ICD-10-CM | POA: Diagnosis not present

## 2022-11-19 DIAGNOSIS — H401123 Primary open-angle glaucoma, left eye, severe stage: Secondary | ICD-10-CM | POA: Diagnosis not present

## 2022-11-26 DIAGNOSIS — H401322 Pigmentary glaucoma, left eye, moderate stage: Secondary | ICD-10-CM | POA: Diagnosis not present

## 2022-11-26 DIAGNOSIS — H401313 Pigmentary glaucoma, right eye, severe stage: Secondary | ICD-10-CM | POA: Diagnosis not present

## 2022-12-08 DIAGNOSIS — H401313 Pigmentary glaucoma, right eye, severe stage: Secondary | ICD-10-CM | POA: Diagnosis not present

## 2022-12-08 DIAGNOSIS — H401322 Pigmentary glaucoma, left eye, moderate stage: Secondary | ICD-10-CM | POA: Diagnosis not present

## 2023-01-08 DIAGNOSIS — H2513 Age-related nuclear cataract, bilateral: Secondary | ICD-10-CM | POA: Diagnosis not present

## 2023-01-08 DIAGNOSIS — H401313 Pigmentary glaucoma, right eye, severe stage: Secondary | ICD-10-CM | POA: Diagnosis not present

## 2023-01-08 DIAGNOSIS — H401322 Pigmentary glaucoma, left eye, moderate stage: Secondary | ICD-10-CM | POA: Diagnosis not present

## 2023-01-14 DIAGNOSIS — L570 Actinic keratosis: Secondary | ICD-10-CM | POA: Diagnosis not present

## 2023-01-14 DIAGNOSIS — L649 Androgenic alopecia, unspecified: Secondary | ICD-10-CM | POA: Diagnosis not present

## 2023-01-14 DIAGNOSIS — Z85828 Personal history of other malignant neoplasm of skin: Secondary | ICD-10-CM | POA: Diagnosis not present

## 2023-02-16 DIAGNOSIS — H401322 Pigmentary glaucoma, left eye, moderate stage: Secondary | ICD-10-CM | POA: Diagnosis not present

## 2023-02-16 DIAGNOSIS — H25812 Combined forms of age-related cataract, left eye: Secondary | ICD-10-CM | POA: Diagnosis not present

## 2023-02-16 DIAGNOSIS — M1712 Unilateral primary osteoarthritis, left knee: Secondary | ICD-10-CM | POA: Diagnosis not present

## 2023-02-16 DIAGNOSIS — Z961 Presence of intraocular lens: Secondary | ICD-10-CM | POA: Diagnosis not present

## 2023-02-16 DIAGNOSIS — S76312A Strain of muscle, fascia and tendon of the posterior muscle group at thigh level, left thigh, initial encounter: Secondary | ICD-10-CM | POA: Diagnosis not present

## 2023-02-16 DIAGNOSIS — H401313 Pigmentary glaucoma, right eye, severe stage: Secondary | ICD-10-CM | POA: Diagnosis not present

## 2023-02-18 DIAGNOSIS — H401313 Pigmentary glaucoma, right eye, severe stage: Secondary | ICD-10-CM | POA: Diagnosis not present

## 2023-02-18 DIAGNOSIS — H401322 Pigmentary glaucoma, left eye, moderate stage: Secondary | ICD-10-CM | POA: Diagnosis not present

## 2023-02-25 DIAGNOSIS — G4733 Obstructive sleep apnea (adult) (pediatric): Secondary | ICD-10-CM | POA: Diagnosis not present

## 2023-03-11 DIAGNOSIS — S76312D Strain of muscle, fascia and tendon of the posterior muscle group at thigh level, left thigh, subsequent encounter: Secondary | ICD-10-CM | POA: Diagnosis not present

## 2023-03-23 DIAGNOSIS — G4733 Obstructive sleep apnea (adult) (pediatric): Secondary | ICD-10-CM | POA: Diagnosis not present

## 2023-03-30 DIAGNOSIS — S76312D Strain of muscle, fascia and tendon of the posterior muscle group at thigh level, left thigh, subsequent encounter: Secondary | ICD-10-CM | POA: Diagnosis not present

## 2023-03-30 DIAGNOSIS — H401313 Pigmentary glaucoma, right eye, severe stage: Secondary | ICD-10-CM | POA: Diagnosis not present

## 2023-03-30 DIAGNOSIS — H401322 Pigmentary glaucoma, left eye, moderate stage: Secondary | ICD-10-CM | POA: Diagnosis not present

## 2023-04-15 DIAGNOSIS — S76312D Strain of muscle, fascia and tendon of the posterior muscle group at thigh level, left thigh, subsequent encounter: Secondary | ICD-10-CM | POA: Diagnosis not present

## 2023-05-05 DIAGNOSIS — H401422 Capsular glaucoma with pseudoexfoliation of lens, left eye, moderate stage: Secondary | ICD-10-CM | POA: Diagnosis not present

## 2023-05-05 DIAGNOSIS — H2512 Age-related nuclear cataract, left eye: Secondary | ICD-10-CM | POA: Diagnosis not present

## 2023-05-05 DIAGNOSIS — Z961 Presence of intraocular lens: Secondary | ICD-10-CM | POA: Diagnosis not present

## 2023-05-05 DIAGNOSIS — H401413 Capsular glaucoma with pseudoexfoliation of lens, right eye, severe stage: Secondary | ICD-10-CM | POA: Diagnosis not present

## 2023-05-06 DIAGNOSIS — S76312D Strain of muscle, fascia and tendon of the posterior muscle group at thigh level, left thigh, subsequent encounter: Secondary | ICD-10-CM | POA: Diagnosis not present

## 2023-05-26 DIAGNOSIS — H401422 Capsular glaucoma with pseudoexfoliation of lens, left eye, moderate stage: Secondary | ICD-10-CM | POA: Diagnosis not present

## 2023-05-26 DIAGNOSIS — H401413 Capsular glaucoma with pseudoexfoliation of lens, right eye, severe stage: Secondary | ICD-10-CM | POA: Diagnosis not present

## 2023-06-29 NOTE — Progress Notes (Signed)
 Cardiology Office Note:  .   Date:  07/02/2023  ID:  Raymond Petty, DOB 19-Apr-1951, MRN [id number] PCP: Loreli Kins, MD  [facility] Providers Cardiologist:  None { History of Present Illness: .   Raymond Petty is a 73 y.o. male with history of pAF, minimal CAD who presents for follow-up.   History of Present Illness   Raymond Petty, a lawyer with a history of paroxysmal atrial fibrillation and minimal coronary artery disease, presents for a follow-up visit. He reports infrequent episodes of atrial fibrillation, which he has been monitoring using a KardiaMobile device. He notes that he feels palpitations just about every day, but is unsure if these are related to his atrial fibrillation. He has had one confirmed episode of atrial fibrillation on his KardiaMobile device in June.  In addition to his cardiac concerns, the patient has been dealing with knee pain. He has been taking meloxicam for this and also received a steroid injection in his knee. He has had to reduce his physical activity due to the knee pain and a pulled hamstring. He has been doing physical therapy for his knee and hamstring.  The patient also has a history of sleep apnea, which is being managed with a CPAP machine. He reports using the machine for an average of five hours per night, with a range of two to seven hours.   BP is normal. Still not wanting a statin.          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 Atrial fibrillation, paroxysmal -<1% burden  -CHADSVASC=1 (age) 3. RBBB 4. OSA -using CPAP 5. HLD -T chol 228, HDL 41, LDL 162, TG 131    ROS: All other ROS reviewed and negative. Pertinent positives noted in the HPI.     Studies Reviewed: SABRA   EKG Interpretation Date/Time:  Thursday July 02 2023 15:50:49 EST Ventricular Rate:  68 PR Interval:  162 QRS Duration:  128 QT Interval:  414 QTC Calculation: 440 R Axis:   36  Text Interpretation: Normal  sinus rhythm Right bundle branch block  Confirmed by Barbaraann Kotyk [phone]) on 07/02/2023 3:53:56 PM   Physical Exam:   VS:  BP 130/72 (BP Location: Right Arm, Patient Position: Sitting, Cuff Size: Normal)   Pulse 68   Ht 5' 11 (1.803 m)   Wt 206 lb (93.4 kg)   BMI 28.73 kg/m    Wt Readings from Last 3 Encounters:  07/02/23 206 lb (93.4 kg)  07/04/22 207 lb (93.9 kg)  07/16/21 205 lb 4.8 oz (93.1 kg)    GEN: Well nourished, well developed in no acute distress NECK: No JVD; No carotid bruits CARDIAC: RRR, no murmurs, rubs, gallops RESPIRATORY:  Clear to auscultation without rales, wheezing or rhonchi  ABDOMEN: Soft, non-tender, non-distended EXTREMITIES:  No edema; No deformity  ASSESSMENT AND PLAN: .   Assessment and Plan    Atrial Fibrillation, paroxysmal  Infrequent episodes of AFib noted on KardiaMobile. No symptoms of chest pain or shortness of breath.  -CHADS-VASc score of 1 due to age. -echo normal. No CAD. CAC score of 1 (20th percentile). -Continue conservative management. -Consider magnesium glycinate supplement 400mg  daily. -Continue CPAP for sleep apnea. -Encourage maintenance of active lifestyle. -Return for follow-up in 1 year.  Hyperlipidemia CAC score of 1 (20th percentile)  Mildly elevated cholesterol levels. Patient prefers conservative management with diet and exercise. -Continue diet and exercise. -Consider statin therapy if cholesterol levels increase.  Osteoarthritis of the Knee  Moderate osteoarthritis causing pain and limiting activity. Currently using a brace and undergoing physical therapy. -Continue conservative management with physical therapy and brace. -Consider Tylenol  500mg  daily as needed for pain. -Consider Voltaren gel for local pain relief. -Avoid long-term use of Meloxicam.              Follow-up: Return in about 1 year (around 07/01/2024).  Time Spent with Patient: I have spent a total of 25 minutes caring for this patient today  face to face, ordering and reviewing labs/tests, reviewing prior records/medical history, examining the patient, establishing an assessment and plan, communicating results/findings to the patient/family, and documenting in the medical record.   Signed, Darryle DASEN. Barbaraann, MD, [facility]  [facility]  [address], [address] [place], [place] [address] [phone]  6:05 PM

## 2023-07-02 ENCOUNTER — Encounter: Payer: Self-pay | Admitting: Cardiovascular Disease

## 2023-07-02 ENCOUNTER — Ambulatory Visit: Payer: Medicare PPO | Attending: Cardiovascular Disease | Admitting: Cardiovascular Disease

## 2023-07-02 VITALS — BP 130/72 | HR 68 | Ht 71.0 in | Wt 206.0 lb

## 2023-07-02 DIAGNOSIS — R931 Abnormal findings on diagnostic imaging of heart and coronary circulation: Secondary | ICD-10-CM

## 2023-07-02 DIAGNOSIS — R030 Elevated blood-pressure reading, without diagnosis of hypertension: Secondary | ICD-10-CM

## 2023-07-02 DIAGNOSIS — I480 Paroxysmal atrial fibrillation: Secondary | ICD-10-CM | POA: Diagnosis not present

## 2023-07-02 DIAGNOSIS — E782 Mixed hyperlipidemia: Secondary | ICD-10-CM

## 2023-07-02 NOTE — Patient Instructions (Signed)
 Medication Instructions:  Your physician recommends that you continue on your current medications as directed. Please refer to the Current Medication list given to you today.   *If you need a refill on your cardiac medications before your next appointment, please call your pharmacy*    Follow-Up: At [facility], you and your health needs are our priority.  As part of our continuing mission to provide you with exceptional heart care, we have created designated Provider Care Teams.  These Care Teams include your primary Cardiologist (physician) and Advanced Practice Providers (APPs -  Physician Assistants and Nurse Practitioners) who all work together to provide you with the care you need, when you need it.  Your next appointment:   1 year(s)  The format for your next appointment:   In Person  Provider:   Darryle Decent, MD     Other Instructions

## 2023-07-10 DIAGNOSIS — H2512 Age-related nuclear cataract, left eye: Secondary | ICD-10-CM | POA: Diagnosis not present

## 2023-07-22 DIAGNOSIS — E785 Hyperlipidemia, unspecified: Secondary | ICD-10-CM | POA: Diagnosis not present

## 2023-07-22 DIAGNOSIS — I4891 Unspecified atrial fibrillation: Secondary | ICD-10-CM | POA: Diagnosis not present

## 2023-07-22 DIAGNOSIS — H4089 Other specified glaucoma: Secondary | ICD-10-CM | POA: Diagnosis not present

## 2023-07-22 DIAGNOSIS — H401422 Capsular glaucoma with pseudoexfoliation of lens, left eye, moderate stage: Secondary | ICD-10-CM | POA: Diagnosis not present

## 2023-07-22 DIAGNOSIS — Z79899 Other long term (current) drug therapy: Secondary | ICD-10-CM | POA: Diagnosis not present

## 2023-07-22 DIAGNOSIS — H2512 Age-related nuclear cataract, left eye: Secondary | ICD-10-CM | POA: Diagnosis not present

## 2023-07-23 DIAGNOSIS — H401422 Capsular glaucoma with pseudoexfoliation of lens, left eye, moderate stage: Secondary | ICD-10-CM | POA: Diagnosis not present

## 2023-07-23 DIAGNOSIS — Z9842 Cataract extraction status, left eye: Secondary | ICD-10-CM | POA: Diagnosis not present

## 2023-07-23 DIAGNOSIS — H401413 Capsular glaucoma with pseudoexfoliation of lens, right eye, severe stage: Secondary | ICD-10-CM | POA: Diagnosis not present

## 2023-07-23 DIAGNOSIS — Z961 Presence of intraocular lens: Secondary | ICD-10-CM | POA: Diagnosis not present

## 2023-07-30 DIAGNOSIS — Z961 Presence of intraocular lens: Secondary | ICD-10-CM | POA: Diagnosis not present

## 2023-07-30 DIAGNOSIS — H401413 Capsular glaucoma with pseudoexfoliation of lens, right eye, severe stage: Secondary | ICD-10-CM | POA: Diagnosis not present

## 2023-07-30 DIAGNOSIS — Z9842 Cataract extraction status, left eye: Secondary | ICD-10-CM | POA: Diagnosis not present

## 2023-07-30 DIAGNOSIS — H401422 Capsular glaucoma with pseudoexfoliation of lens, left eye, moderate stage: Secondary | ICD-10-CM | POA: Diagnosis not present

## 2023-08-27 DIAGNOSIS — Z9842 Cataract extraction status, left eye: Secondary | ICD-10-CM | POA: Diagnosis not present

## 2023-08-27 DIAGNOSIS — H401422 Capsular glaucoma with pseudoexfoliation of lens, left eye, moderate stage: Secondary | ICD-10-CM | POA: Diagnosis not present

## 2023-08-27 DIAGNOSIS — Z961 Presence of intraocular lens: Secondary | ICD-10-CM | POA: Diagnosis not present

## 2023-08-27 DIAGNOSIS — H04129 Dry eye syndrome of unspecified lacrimal gland: Secondary | ICD-10-CM | POA: Diagnosis not present

## 2023-08-27 DIAGNOSIS — H401413 Capsular glaucoma with pseudoexfoliation of lens, right eye, severe stage: Secondary | ICD-10-CM | POA: Diagnosis not present

## 2023-09-02 DIAGNOSIS — G4733 Obstructive sleep apnea (adult) (pediatric): Secondary | ICD-10-CM | POA: Diagnosis not present

## 2023-09-15 DIAGNOSIS — L820 Inflamed seborrheic keratosis: Secondary | ICD-10-CM | POA: Diagnosis not present

## 2023-09-15 DIAGNOSIS — L821 Other seborrheic keratosis: Secondary | ICD-10-CM | POA: Diagnosis not present

## 2023-09-15 DIAGNOSIS — L570 Actinic keratosis: Secondary | ICD-10-CM | POA: Diagnosis not present

## 2023-09-15 DIAGNOSIS — D485 Neoplasm of uncertain behavior of skin: Secondary | ICD-10-CM | POA: Diagnosis not present

## 2023-09-15 DIAGNOSIS — L853 Xerosis cutis: Secondary | ICD-10-CM | POA: Diagnosis not present

## 2023-09-15 DIAGNOSIS — D1801 Hemangioma of skin and subcutaneous tissue: Secondary | ICD-10-CM | POA: Diagnosis not present

## 2023-09-15 DIAGNOSIS — Z85828 Personal history of other malignant neoplasm of skin: Secondary | ICD-10-CM | POA: Diagnosis not present

## 2023-09-15 DIAGNOSIS — L814 Other melanin hyperpigmentation: Secondary | ICD-10-CM | POA: Diagnosis not present

## 2023-09-24 DIAGNOSIS — I480 Paroxysmal atrial fibrillation: Secondary | ICD-10-CM | POA: Diagnosis not present

## 2023-09-24 DIAGNOSIS — H409 Unspecified glaucoma: Secondary | ICD-10-CM | POA: Diagnosis not present

## 2023-09-24 DIAGNOSIS — G4733 Obstructive sleep apnea (adult) (pediatric): Secondary | ICD-10-CM | POA: Diagnosis not present

## 2023-09-24 DIAGNOSIS — Z0000 Encounter for general adult medical examination without abnormal findings: Secondary | ICD-10-CM | POA: Diagnosis not present

## 2023-09-24 DIAGNOSIS — E7800 Pure hypercholesterolemia, unspecified: Secondary | ICD-10-CM | POA: Diagnosis not present

## 2023-09-24 DIAGNOSIS — R7301 Impaired fasting glucose: Secondary | ICD-10-CM | POA: Diagnosis not present

## 2023-09-24 DIAGNOSIS — R972 Elevated prostate specific antigen [PSA]: Secondary | ICD-10-CM | POA: Diagnosis not present

## 2023-09-24 LAB — LAB REPORT - SCANNED
A1c: 5.7
EGFR: 96

## 2023-09-30 DIAGNOSIS — G4733 Obstructive sleep apnea (adult) (pediatric): Secondary | ICD-10-CM | POA: Diagnosis not present

## 2023-10-01 DIAGNOSIS — H401413 Capsular glaucoma with pseudoexfoliation of lens, right eye, severe stage: Secondary | ICD-10-CM | POA: Diagnosis not present

## 2023-10-01 DIAGNOSIS — Z961 Presence of intraocular lens: Secondary | ICD-10-CM | POA: Diagnosis not present

## 2023-10-01 DIAGNOSIS — H04129 Dry eye syndrome of unspecified lacrimal gland: Secondary | ICD-10-CM | POA: Diagnosis not present

## 2023-10-01 DIAGNOSIS — H401422 Capsular glaucoma with pseudoexfoliation of lens, left eye, moderate stage: Secondary | ICD-10-CM | POA: Diagnosis not present

## 2023-11-19 DIAGNOSIS — L02419 Cutaneous abscess of limb, unspecified: Secondary | ICD-10-CM | POA: Diagnosis not present

## 2023-11-19 DIAGNOSIS — L0390 Cellulitis, unspecified: Secondary | ICD-10-CM | POA: Diagnosis not present

## 2023-11-20 DIAGNOSIS — R350 Frequency of micturition: Secondary | ICD-10-CM | POA: Diagnosis not present

## 2023-11-20 DIAGNOSIS — R700 Elevated erythrocyte sedimentation rate: Secondary | ICD-10-CM | POA: Diagnosis not present

## 2023-12-30 DIAGNOSIS — R700 Elevated erythrocyte sedimentation rate: Secondary | ICD-10-CM | POA: Diagnosis not present

## 2024-01-04 DIAGNOSIS — H401413 Capsular glaucoma with pseudoexfoliation of lens, right eye, severe stage: Secondary | ICD-10-CM | POA: Diagnosis not present

## 2024-01-04 DIAGNOSIS — H401422 Capsular glaucoma with pseudoexfoliation of lens, left eye, moderate stage: Secondary | ICD-10-CM | POA: Diagnosis not present

## 2024-02-03 DIAGNOSIS — N401 Enlarged prostate with lower urinary tract symptoms: Secondary | ICD-10-CM | POA: Diagnosis not present

## 2024-02-11 DIAGNOSIS — R350 Frequency of micturition: Secondary | ICD-10-CM | POA: Diagnosis not present

## 2024-02-11 DIAGNOSIS — N401 Enlarged prostate with lower urinary tract symptoms: Secondary | ICD-10-CM | POA: Diagnosis not present

## 2024-02-14 DIAGNOSIS — Z818 Family history of other mental and behavioral disorders: Secondary | ICD-10-CM | POA: Diagnosis not present

## 2024-02-14 DIAGNOSIS — Z85828 Personal history of other malignant neoplasm of skin: Secondary | ICD-10-CM | POA: Diagnosis not present

## 2024-02-14 DIAGNOSIS — Z809 Family history of malignant neoplasm, unspecified: Secondary | ICD-10-CM | POA: Diagnosis not present

## 2024-02-14 DIAGNOSIS — M1990 Unspecified osteoarthritis, unspecified site: Secondary | ICD-10-CM | POA: Diagnosis not present

## 2024-02-14 DIAGNOSIS — Z823 Family history of stroke: Secondary | ICD-10-CM | POA: Diagnosis not present

## 2024-02-14 DIAGNOSIS — Z8249 Family history of ischemic heart disease and other diseases of the circulatory system: Secondary | ICD-10-CM | POA: Diagnosis not present

## 2024-02-14 DIAGNOSIS — I4891 Unspecified atrial fibrillation: Secondary | ICD-10-CM | POA: Diagnosis not present

## 2024-02-14 DIAGNOSIS — E785 Hyperlipidemia, unspecified: Secondary | ICD-10-CM | POA: Diagnosis not present

## 2024-02-14 DIAGNOSIS — H409 Unspecified glaucoma: Secondary | ICD-10-CM | POA: Diagnosis not present

## 2024-02-15 DIAGNOSIS — G4733 Obstructive sleep apnea (adult) (pediatric): Secondary | ICD-10-CM | POA: Diagnosis not present

## 2024-06-21 ENCOUNTER — Other Ambulatory Visit (HOSPITAL_BASED_OUTPATIENT_CLINIC_OR_DEPARTMENT_OTHER): Payer: Self-pay

## 2024-06-21 MED ORDER — PROMETHAZINE-DM 6.25-15 MG/5ML PO SYRP
5.0000 mL | ORAL_SOLUTION | ORAL | 0 refills | Status: AC
  Filled 2024-06-21: qty 210, 7d supply, fill #0
